# Patient Record
Sex: Female | Born: 1970
Health system: Southern US, Community
[De-identification: ages and names within clinical notes are randomized; demographics above are authoritative.]

## PROBLEM LIST (undated history)

## (undated) DIAGNOSIS — Q909 Down syndrome, unspecified: Secondary | ICD-10-CM

## (undated) DIAGNOSIS — E669 Obesity, unspecified: Secondary | ICD-10-CM

## (undated) DIAGNOSIS — E785 Hyperlipidemia, unspecified: Secondary | ICD-10-CM

## (undated) DIAGNOSIS — E039 Hypothyroidism, unspecified: Secondary | ICD-10-CM

## (undated) DIAGNOSIS — M47812 Spondylosis without myelopathy or radiculopathy, cervical region: Secondary | ICD-10-CM

## (undated) DIAGNOSIS — I2699 Other pulmonary embolism without acute cor pulmonale: Secondary | ICD-10-CM

## (undated) DIAGNOSIS — I6529 Occlusion and stenosis of unspecified carotid artery: Secondary | ICD-10-CM

## (undated) DIAGNOSIS — L732 Hidradenitis suppurativa: Secondary | ICD-10-CM

## (undated) DIAGNOSIS — I951 Orthostatic hypotension: Secondary | ICD-10-CM

## (undated) DIAGNOSIS — J309 Allergic rhinitis, unspecified: Secondary | ICD-10-CM

## (undated) DIAGNOSIS — R001 Bradycardia, unspecified: Secondary | ICD-10-CM

## (undated) DIAGNOSIS — I341 Nonrheumatic mitral (valve) prolapse: Secondary | ICD-10-CM

## (undated) HISTORY — DX: Bradycardia, unspecified: R00.1

## (undated) HISTORY — PX: OTHER SURGICAL HISTORY: SHX169

## (undated) HISTORY — DX: Orthostatic hypotension: I95.1

## (undated) HISTORY — DX: Down syndrome, unspecified: Q90.9

## (undated) HISTORY — DX: Obesity, unspecified: E66.9

## (undated) HISTORY — DX: Hidradenitis suppurativa: L73.2

## (undated) HISTORY — DX: Spondylosis without myelopathy or radiculopathy, cervical region: M47.812

## (undated) HISTORY — DX: Hyperlipidemia, unspecified: E78.5

## (undated) HISTORY — DX: Occlusion and stenosis of unspecified carotid artery: I65.29

## (undated) HISTORY — DX: Other pulmonary embolism without acute cor pulmonale: I26.99

## (undated) HISTORY — DX: Nonrheumatic mitral (valve) prolapse: I34.1

## (undated) HISTORY — PX: MIDDLE EAR SURGERY: SHX713

## (undated) HISTORY — DX: Hypothyroidism, unspecified: E03.9

## (undated) HISTORY — PX: TONSILLECTOMY AND ADENOIDECTOMY: SUR1326

## (undated) HISTORY — DX: Allergic rhinitis, unspecified: J30.9

---

## 2000-03-02 ENCOUNTER — Encounter: Admission: RE | Admit: 2000-03-02 | Discharge: 2000-03-19 | Payer: Self-pay | Admitting: Orthopedic Surgery

## 2000-11-04 ENCOUNTER — Encounter: Admission: RE | Admit: 2000-11-04 | Discharge: 2000-11-30 | Payer: Self-pay | Admitting: *Deleted

## 2001-02-25 ENCOUNTER — Encounter: Payer: Self-pay | Admitting: *Deleted

## 2001-02-25 ENCOUNTER — Emergency Department (HOSPITAL_COMMUNITY): Admission: EM | Admit: 2001-02-25 | Discharge: 2001-02-25 | Payer: Self-pay | Admitting: *Deleted

## 2001-04-18 ENCOUNTER — Other Ambulatory Visit: Admission: RE | Admit: 2001-04-18 | Discharge: 2001-04-18 | Payer: Self-pay | Admitting: Gynecology

## 2003-06-21 ENCOUNTER — Other Ambulatory Visit: Admission: RE | Admit: 2003-06-21 | Discharge: 2003-06-21 | Payer: Self-pay | Admitting: Gynecology

## 2005-02-20 ENCOUNTER — Ambulatory Visit (HOSPITAL_COMMUNITY): Admission: RE | Admit: 2005-02-20 | Discharge: 2005-02-20 | Payer: Self-pay | Admitting: Family Medicine

## 2007-04-07 ENCOUNTER — Other Ambulatory Visit: Admission: RE | Admit: 2007-04-07 | Discharge: 2007-04-07 | Payer: Self-pay | Admitting: Gynecology

## 2007-07-11 ENCOUNTER — Encounter: Admission: RE | Admit: 2007-07-11 | Discharge: 2007-07-11 | Payer: Self-pay | Admitting: Otolaryngology

## 2007-07-26 ENCOUNTER — Ambulatory Visit: Payer: Self-pay | Admitting: Oncology

## 2007-08-24 LAB — CBC WITH DIFFERENTIAL/PLATELET
BASO%: 1 % (ref 0.0–2.0)
Basophils Absolute: 0 10*3/uL (ref 0.0–0.1)
EOS%: 0.4 % (ref 0.0–7.0)
Eosinophils Absolute: 0 10*3/uL (ref 0.0–0.5)
HCT: 38.4 % (ref 34.8–46.6)
HGB: 13.4 g/dL (ref 11.6–15.9)
LYMPH%: 24.3 % (ref 14.0–48.0)
MCH: 33.1 pg (ref 26.0–34.0)
MCHC: 34.8 g/dL (ref 32.0–36.0)
MCV: 95 fL (ref 81.0–101.0)
MONO#: 0.4 10*3/uL (ref 0.1–0.9)
MONO%: 9 % (ref 0.0–13.0)
NEUT#: 3.1 10*3/uL (ref 1.5–6.5)
NEUT%: 65.2 % (ref 39.6–76.8)
Platelets: 221 10*3/uL (ref 145–400)
RBC: 4.04 10*6/uL (ref 3.70–5.32)
RDW: 11.3 % (ref 11.3–14.5)
WBC: 4.7 10*3/uL (ref 3.9–10.0)
lymph#: 1.2 10*3/uL (ref 0.9–3.3)

## 2007-08-24 LAB — MORPHOLOGY
PLT EST: ADEQUATE
RBC Comments: NORMAL

## 2007-08-24 LAB — CHCC SMEAR

## 2007-08-24 LAB — ERYTHROCYTE SEDIMENTATION RATE: Sed Rate: 10 mm/hr (ref 0–30)

## 2007-08-27 LAB — CMV IGM: CMV IgM: <0.9 Index (ref <0.90)

## 2007-08-27 LAB — HEPATITIS C ANTIBODY: HCV Ab: NEGATIVE

## 2007-08-27 LAB — EPSTEIN-BARR VIRUS VCA, IGG: EBV VCA IgG: 4.95 {ISR} — ABNORMAL HIGH

## 2007-08-27 LAB — EPSTEIN-BARR VIRUS NUCLEAR ANTIGEN ANTIBODY, IGG: EBV NA IgG: 3.96 {ISR} — ABNORMAL HIGH

## 2007-09-15 ENCOUNTER — Ambulatory Visit: Payer: Self-pay | Admitting: Oncology

## 2007-11-02 ENCOUNTER — Observation Stay (HOSPITAL_COMMUNITY): Admission: AD | Admit: 2007-11-02 | Discharge: 2007-11-03 | Payer: Self-pay | Admitting: Otolaryngology

## 2007-11-02 ENCOUNTER — Encounter (INDEPENDENT_AMBULATORY_CARE_PROVIDER_SITE_OTHER): Payer: Self-pay | Admitting: Otolaryngology

## 2008-11-19 ENCOUNTER — Encounter: Admission: RE | Admit: 2008-11-19 | Discharge: 2009-01-28 | Payer: Self-pay | Admitting: Orthopedic Surgery

## 2008-12-18 IMAGING — CT CT ORBIT/TEMPORAL/IAC W/O CM
3 of 5 series · 16 of 30 positions shown, 18 images · IV contrast (agent unspecified)
Comparison: None.

CLINICAL DATA: 36 year old female with hearing loss in the right ear.  History of tubes.  Down?s syndrome.  Additional history of Moyamoya status post brain bypass.  
TEMPORAL BONE CT WITHOUT CONTRAST:
TECHNIQUE: Axial and coronal plane CT imaging of the petrous temporal bones was performed with thin-collimation image reconstruction.  No intravenous contrast was administered.

[Series 2: temporal bones/iac's · axial · 0.33mm/px · z∈[-11,-1]mm · 2 of 64 slices shown]
[im 16/64  bone]
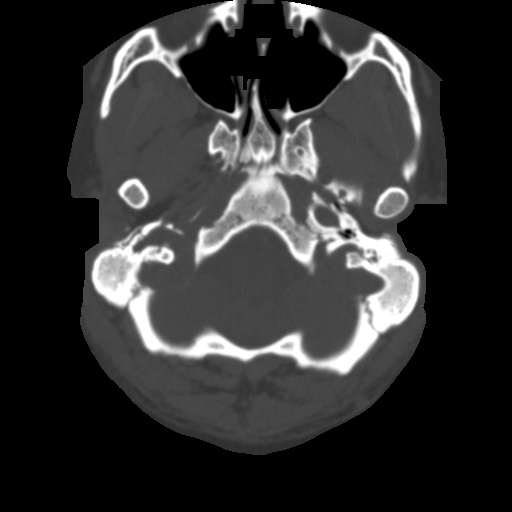
[im 32/64  bone]
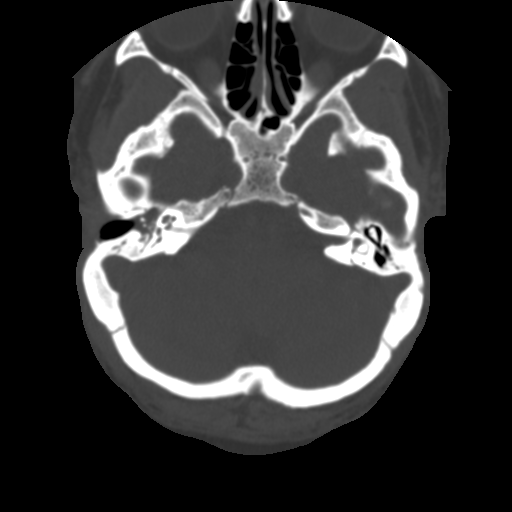

[Series 3: rt/mag/bone · axial · 0.20mm/px · z∈[-16,+14]mm · 7 of 126 slices shown, 9 images]
[im 16/126  brain]
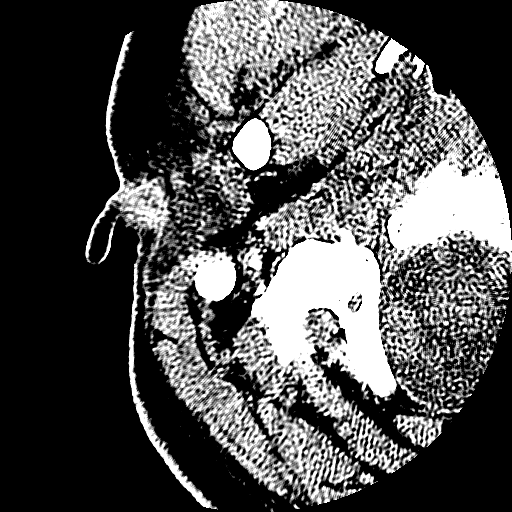
[im 16/126  bone]
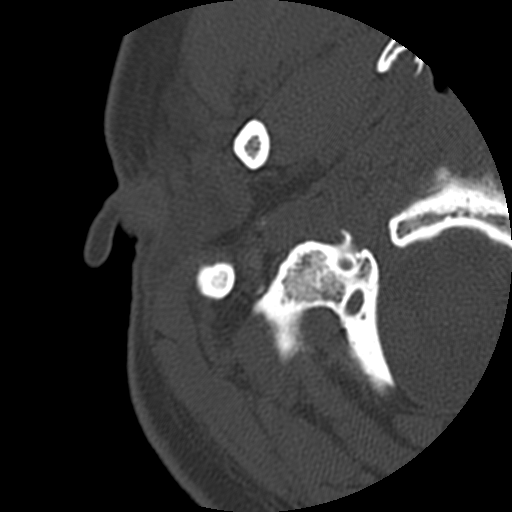
[im 32/126  bone]
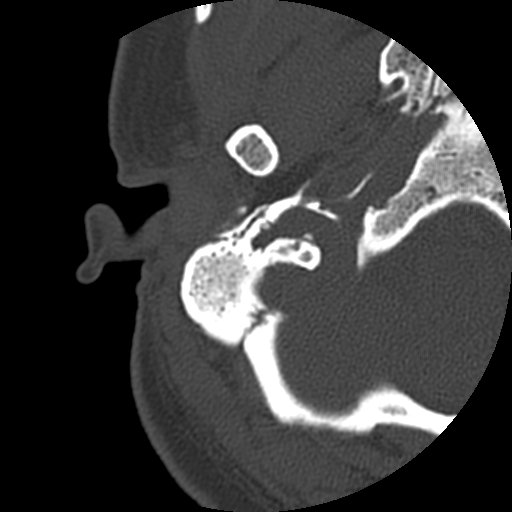
[im 47/126  bone]
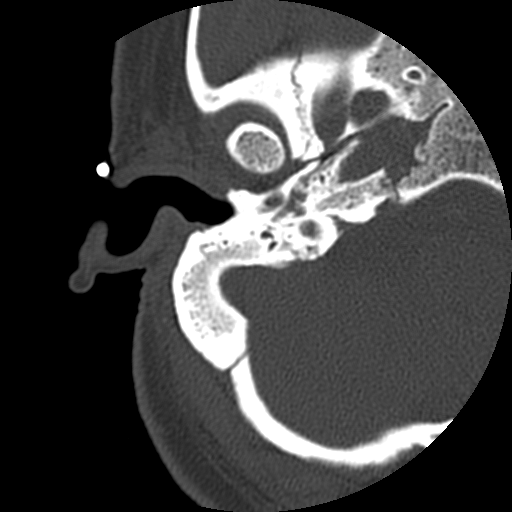
[im 63/126  bone]
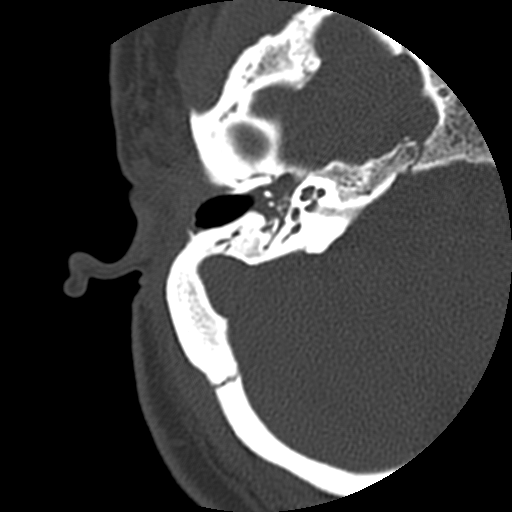
[im 79/126  brain]
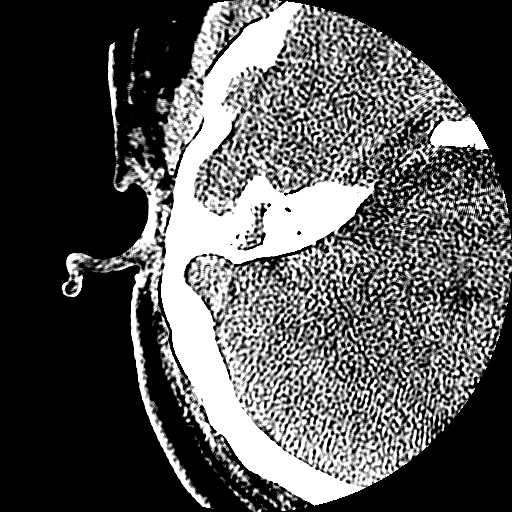
[im 79/126  bone]
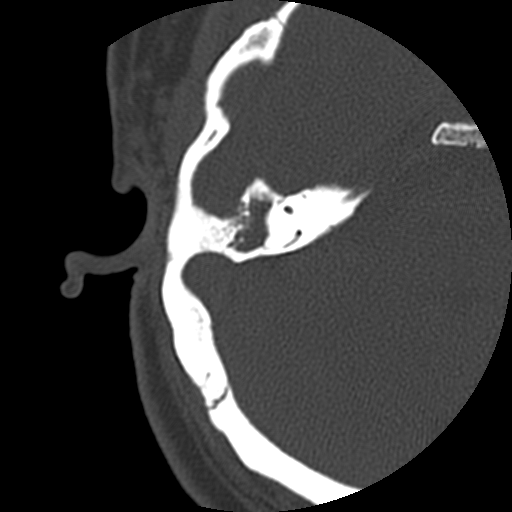
[im 94/126  bone]
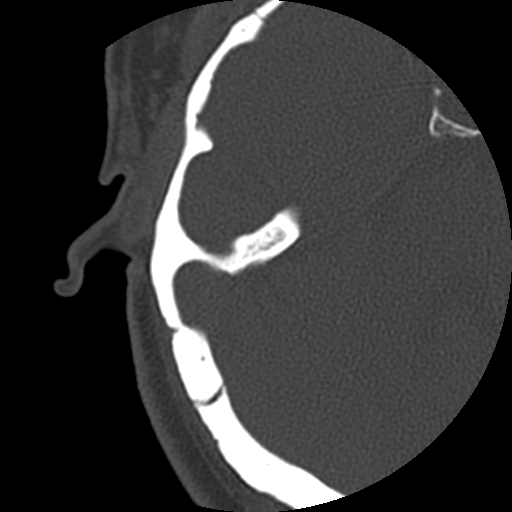
[im 110/126  bone]
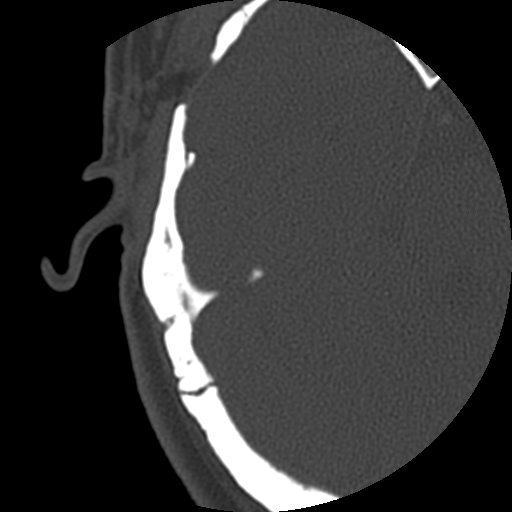

[Series 4: lt/mag/bone · axial · 0.20mm/px · z∈[-16,+14]mm · 7 of 126 slices shown]
[im 16/126  bone]
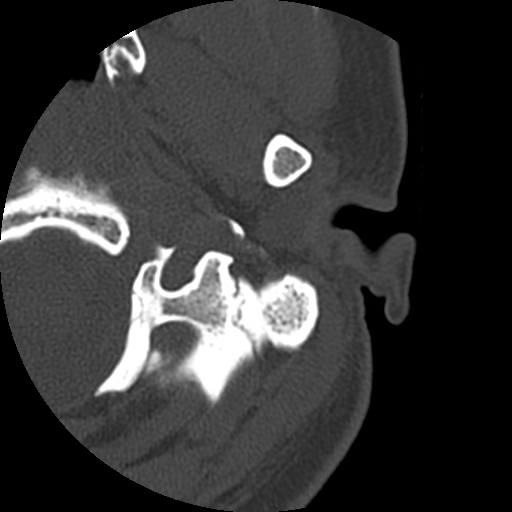
[im 32/126  bone]
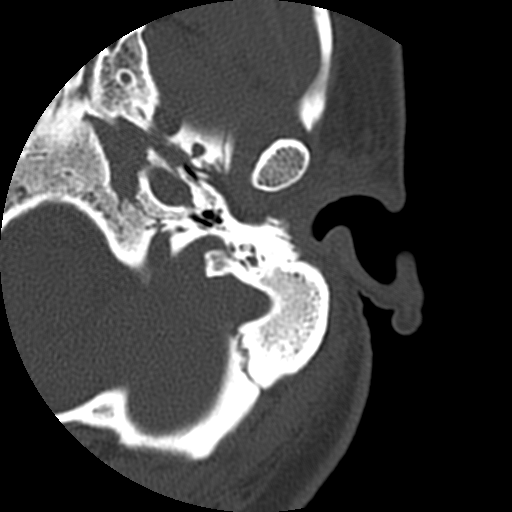
[im 47/126  bone]
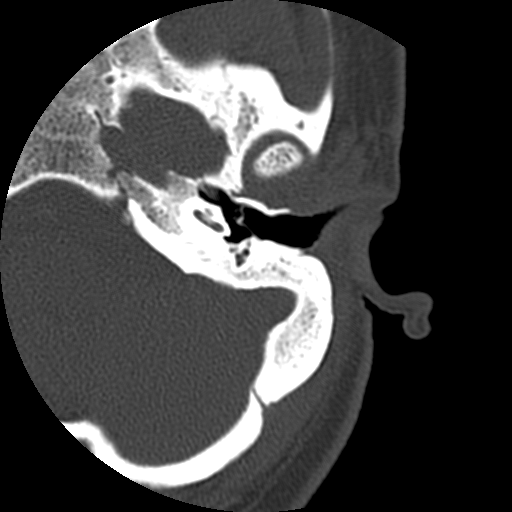
[im 63/126  bone]
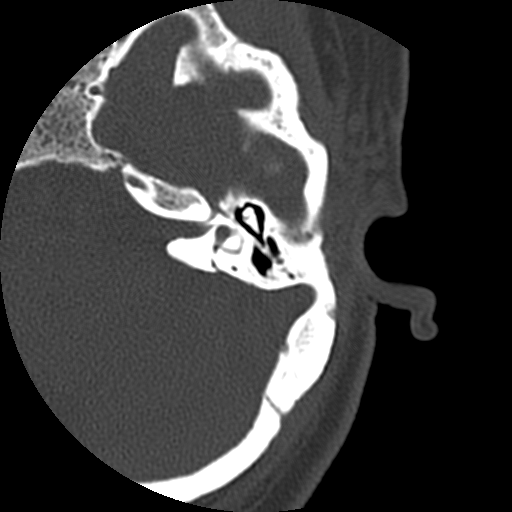
[im 79/126  bone]
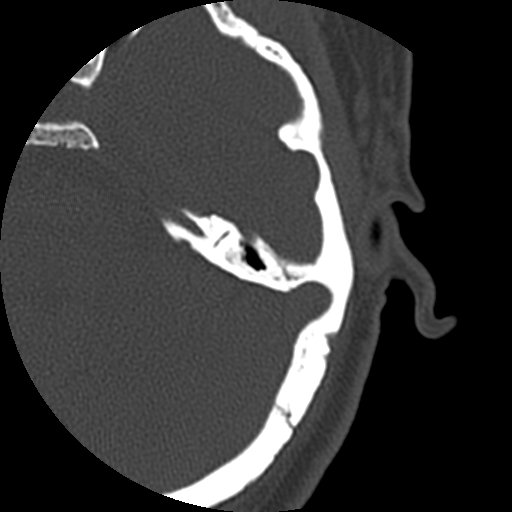
[im 94/126  bone]
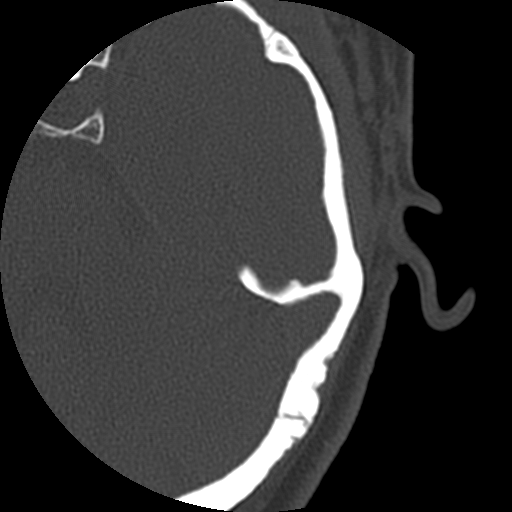
[im 110/126  bone]
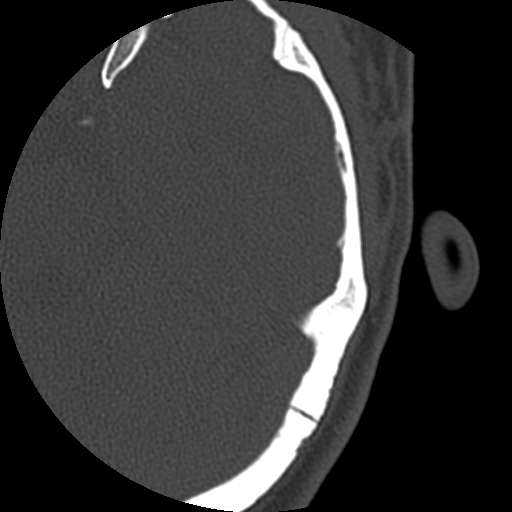

[16 of 30 positions shown; findings below may reference images not displayed]

FINDINGS: Visualized brain parenchyma is remarkable for generalized volume loss.  Sequelae of bitemporal craniotomy are partially visualized.  Visualized posterior orbits are normal.  Visualized paranasal sinuses are clear.  There is hypoplasia of the sphenoid sinuses.  
There is hypoplasia of the left mastoid air cells.  The aditus ad antrum and tympanic cavity are normally pneumatized.  There is abnormal thickening of the tympanic membrane, especially its inferior attachment.  The ossicles appear normally located and within the normal limits.  The EAC, IAC, cochlea, vestibular apparatus, and course of the VIIth cranial nerve are within normal limits.  
The right mastoid air cells are hypoplastic and are completely opacified.  No osseous erosion is identified and the sigmoid plate appears intact.  There is complete opacification of the right tympanic cavity.  The ossicles are not dislocated and appear within normal limits.  The scutum appears blunted.  The EAC is within normal limits.  The tympanic membrane appears internally retracted.  A tympanostomy tube is in place.  The IAC, cochlea and vestibular apparatus are normal.   The course of the right VIIth cranial nerve is within normal limits.
IMPRESSION: 1.  Right tympanostomy tube in place with complete opacification of the right tympanic cavity and mastoids.  This could reflect infectious/inflammatory otomastoiditis or cholesteatoma given the blunted appearance of the right scutum.
2.  Thickening of the anteroinferior left tympanic membrane, otherwise the left temporal bone is normal.
3.  Bilateral hypoplastic mastoid air cells.

## 2009-05-08 ENCOUNTER — Encounter: Admission: RE | Admit: 2009-05-08 | Discharge: 2009-05-08 | Payer: Self-pay | Admitting: Otolaryngology

## 2010-12-07 ENCOUNTER — Encounter: Payer: Self-pay | Admitting: Otolaryngology

## 2011-03-31 NOTE — H&P (Signed)
NAMESHELLBY, SCHLINK NO.:  [id number]   MEDICAL RECORD NO.:  [id number]          PATIENT TYPE:  INP   LOCATION:  5122                         FACILITY:  [facility]   PHYSICIAN:  Carolan Shiver, M.D.    DATE OF BIRTH:  May 15, 1971   DATE OF ADMISSION:  11/02/2007  DATE OF DISCHARGE:                              HISTORY & PHYSICAL   CHIEF COMPLAINT:  Decreased hearing, right ear, with chronic infection.   HISTORY OF PRESENT ILLNESS:  Nicole Wilson is a 40 year old white  female with trisomy 43 who has developed chronic right mastoiditis and  otitis media with moderate mixed hearing loss, right ear, after lifelong  history of chronic ear disease.  Nicole Wilson first saw me at age 51.  She had  had tubes placed in 1979 by another surgeon, and they were removed in  1992, again by another surgeon.  When seen by me, she was found to have  central perforation of the right tympanic membrane and had trisomy 21.  On January 04, 1985, she underwent a type 1 tympanoplasty right ear  without complication.  Subsequent to that, she required revision BMTs on  January 11, 1987 for recurrent otitis media.  She then was followed.  Her tubes remained patent through 2001.  February 2001, she suffered a  mild stroke with numb hand and was diagnosed as having moyamoya disease.  In January 2003, she underwent a right intracranial to extracranial  bypass graft which was successful.  Tubes remained patent through April  of 2004.  They again were patent February 06, 2005, December 14, 2005.  However, the tubes ejected, and she required revision BMTs on December 06, 2006.  The left tube remained in place, the right tube ejected and  she developed recurrent thick mucoid effusion right ear and a moderate  mixed hearing loss.  A CT scan of her temporal bones documented chronic  right mastoiditis and otitis media, and she was recommended for right  canal tympanomastoidectomy, placement of a tube, right ear.   Risks and  complications of the procedures were explained to her mother.  Questions  were invited and answered.  Informed consent was signed and witnessed.   PAST MEDICAL HISTORY:  1. January 2003, right extracranial to intracranial bypass grafting of      her middle meningeal artery for moyamoya disease.  2. History of chronic ear disease.  3. T&A 1980s.  4. She had tubes placed in 1979, 1982 and as I mentioned above.   MEDICATIONS:  Currently include Synthroid, Lipitor, Zyrtec and aspirin.  The aspirin had been held.   ALLERGIES TO MEDICATIONS:  INCLUDE DILANTIN AND CEPHALOSPORINS.   SOCIAL HISTORY:  She lives with her parents and does have some  intermittent work.   REVIEW OF SYSTEMS:  Was negative for any heart disease, dermatitis, GI  disease or orthopedic problems.  She does have history of hypothyroidism  and is on thyroid replacement.  She had had C-spine films in the past  which were stable.   PHYSICAL EXAMINATION:  VITAL SIGNS:  Stable.  She  was an awake, alert 40 year old white female with trisomy 37.  She  had short stature.  She is very conversant with 85% intelligibility.  Facial function was intact to nystagmus.  Pupils PERLA.  External ears  were small and low set.  Right TM was thickened without landmarks and  absent tube.  Left tube is in position.  The ear was dried.  Nose  negative.  Oral cavity, lips, tongue, palate normal.  NECK:  Short, no bruits.  CHEST:  Clear.  HEART:  Normal sinus rhythm.  BREAST EXAM:  Was not done.  ABDOMINAL EXAM:  Negative.  GENITALIA/RECTAL EXAMS:  Were not done.  EXTREMITIES:  Were unremarkable.  SKULL:  She had a healed right postauricular incision and right  neurosurgical incision from the extracranial to intracranial bypass done  January of 2003 for moyamoya disease.   LABORATORY DATA:  Showed admission hemoglobin of 13.3, hematocrit of  39.2, white blood cell count of 4900.  PT was 13.4, PTT 26, INR 1.0.  Electrolytes  showed a sodium of 139, potassium 4.0, chloride 108, CO2  28, glucose was 92, BUN 10, creatinine 0.93.  Urinalysis was  unremarkable.  EKG done several years ago at age 74 showed sinus  bradycardia with marked sinus arrhythmia.   CT scan of her temporal bones performed July 11, 2007 showed chronic  right mastoiditis and otitis media.   IMPRESSION:  1. Chronic right otitis media and mastoiditis status post      tympanoplasty and multiple BMTs in the past.  2. Trisomy 21.  3. History of moyamoya disease status post right extracranial to      intracranial bypass.  4. Hypothyroidism.  5. Moderate mixed hearing loss, right ear, secondary to #1 above.   Recommend right canal tympanomastoidectomy with placement of tube, right  ear, under general endotracheal anesthesia at [facility]  November 02, 2007.  Nicole Wilson's mother understands that she may require a  prosthesis, bone cement, etc., for an ossiculoplasty.  She understands  that if she had congenital fixation of her stapes that I would not  recommend a stapedectomy as stapedectomies are not generally successful  in patient's with Down syndrome.  The mother is fully aware of this and  signed the informed consent.  Procedure is scheduled for November 02, 2007 at 9:30 a.m. in [place] OR room #10, Dr. Jacklynn Bue, general  anesthesia.           ______________________________  Carolan Shiver, M.D.     EMK/MEDQ  D:  11/02/2007  T:  11/03/2007  Job:  [id number]   cc:   Carolan Shiver, M.D.

## 2011-03-31 NOTE — Op Note (Signed)
NAMESEDONA, WENK NO.:  [id number]   MEDICAL RECORD NO.:  [id number]          PATIENT TYPE:  INP   LOCATION:  5122                         FACILITY:  [facility]   PHYSICIAN:  Carolan Shiver, M.D.    DATE OF BIRTH:  10-24-1971   DATE OF PROCEDURE:  11/02/2007  DATE OF DISCHARGE:                               OPERATIVE REPORT   JUSTIFICATION FOR PROCEDURE:  Nicole Wilson is a 40 year old white  female with trisomy 76 here today for right tympanomastoidectomy to  treat chronic right mastoiditis and otitis media with moderate mixed  hearing loss, right ear.  [name] has had a lifelong history of chronic ear  disease.  She was first seen by me at age 43.  She had had tubes placed  in her ears in 1979 and removed in 1982.  I performed a type 1  tympanoplasty of the right ear on January 04, 1985.  She healed well.  She underwent revision BMT on January 11, 1987, and has been followed  routinely since that time.  By September 08, 2000, both tubes were patent.  They remained patent through February 22, 2003, and through February 06, 2005.  December 06, 2006, she underwent revision BMT as a tube was embedded in  right middle ear mucosa and she was found to have thick fluid.  That  tube eventually ejected after July 22, 2007.  She was complaining of  decreased hearing in the right ear. Audiometric testing showed a  moderate mixed hearing loss.  A CT scan of her temporal bone showed  chronic right mastoiditis and otitis media.  Because of this, she was  recommended for right tympanomastoidectomy and replacement of the tube,  right ear.  The risks and complications of the procedures were explained  Katricia and to her mother.  Questions were invited and answered.  Informed  consent was signed and witnessed.   JUSTIFICATION FOR OUTPATIENT SETTING:  The patient's age and general  endotracheal anesthesia.   JUSTIFICATION FOR OVERNIGHT STAY:  1. 23 hours of observation and facial function  status post right canal      up tympanomastoidectomy.  2. The patient has trisomy 4.   PREOPERATIVE DIAGNOSIS:  1. Chronic otitis media and mastoiditis, right ear.  2. Trisomy 21.  3. Moderate mixed hearing loss, right ear.   POSTOPERATIVE DIAGNOSES:  1. Chronic otitis media and mastoiditis, right ear.  2. Trisomy 21.  3. Moderate mixed hearing loss, right ear.   OPERATION:  Right canal up tympanomastoidectomy with placement of  Paparella type 1 tube, right ear.   SURGEON:  Carolan Shiver, M.D.   ANESTHESIA:  General endotracheal anesthesia, Dr. Sharee Holster.   COMPLICATIONS:  None.   SUMMARY OF REPORT:  After the patient was taken to the operating room,  she was placed in the supine position.  General IV induction was then  performed under the guidance of Dr. Sharee Holster. The patient was  orally intubated without difficulty.  Eyelids were taped shut.  She was  properly positioned and monitored.  Elbows and ankles were  padded with  foam rubber and a time out was performed.  A Foley catheter was  inserted.  Several wire needle electrodes were inserted into the right  orbicularis oris and oculi muscles and connected to a facial nerve  monitor pre-amplifier.  Her hair was clipped in the right postauricular  area, hair was taped, stocking cap was applied.  The previous right  postauricular incision was then marked and infiltrated with 7 mL of 1%  Xylocaine with 1:100,000 epinephrine.  The right ear and hemi-face were  then prepped with Betadine and draped in a standard fashion for a  tympanomastoidectomy.   Examination of the right ear canal revealed large crust in the right  canal.  This was removed.  Her TM was thickened and without very many  landmarks. She had a prominent anterior canal wall hump.  The ear canal  was cleaned.  Four quadrant ear canal blocks were performed with 1 mL of  2% Xylocaine with 1:50,000 epinephrine.  A right postauricular incision,  which had  been marked and tattooed, was then incised with a 15 blade.  A  T-shaped incision was made.  The mastoid, periosteum, anterior and  posterior subperiosteal flaps were elevated.  The mastoid cortex was  then removed with cutting burs using continuous suction irrigation.  The  patient was found to have an anteriorly displaced sigmoid sinus and a  very low hanging dura.  Upon removing 1 mm of the cortex just inferior  to the temporal line, I encountered dura.  Dura was extending inferiorly  more then in most ears.  The dura actually extended to the superior  canal wall.  The sigmoid sinus was identified and followed to the  digastric periosteum.  The posterior canal wall was thinned. The mastoid  antrum was then entered carefully and was found to be filled with  chronically infected tissue.  The contents were removed and I could just  discern the dome of the lateral semicircular canal.  I could not see  into the attic because of the low hanging dura which was literally  extended to the superior canal wall.   The posterior canal wall skin was then dissected medially to the  annulus.  The middle ear was entered posterior superiorly.  The ossicles  were found to be intact.  They were engulfed in thick fibrotic scar  tissue, particularly the lenticular process and the stapes. Scar tissue  was removed freeing the ossicles.  The malleus was mobile but was not  normally mobile.  The incus and stapes were mobile.  The stapes had very  broad crura.  The facial nerve was covered in the horizontal portion by  bone.  All scar bands were lysed.  An incision was made in the posterior  canal wall skin between 6 and 12 o'clock with a 5910 beaver blade.  An  anterior-inferior radial myringotomy incision was made and thick fluid  was suction evacuated from a compartmentalized pocket anteriorly.  Further adhesions were then lysed in the middle ear.  A modified  Richards T-tube was inserted.  A temporalis  fascial graft was harvested  and placed medial to the posterior superior quadrant of the tympanic  membrane for reinforcement.  A sliver of Penrose drain was placed in the  mastoid cavity.   The entire cavity was copiously irrigated with bacitracin containing  saline.  The saline did irrigate from the antrum through the attic into  the middle ear.  The temporalis fascial graft and  atticotomy flap were  draped off the posterior bony canal wall in the anatomic position.  The  medial canal was packed with Gelfoam soaked in Ciprodex, the lateral  canal was packed with 1/4 inch Iodoform Nugauze wick impregnated with  bacitracin ointment.  The postauricular incision was then closed in  three layers using interrupted inverted 3-0 Monocryl for mucoperiosteal  layer and the same for the subcutaneous layer. Prior to closing the  subcutaneous layer, the site was copiously irrigated with bacitracin  containing saline.  The skin was closed with running locking 5-0  Ethilon.  Bacitracin ointment was applied to the postauricular incision.  The ear was padded with Telfa and cotton and a standard adult Glasscock  mastoid dressing was applied loosely in the standard fashion.  The  patient's Foley catheter was removed.  She was awakened, extubated and  transferred to her hospital bed.  She appeared to tolerate the general  endotracheal anesthesia and the procedures well and left the operating  room in stable condition.   Total fluids 1800 mL.  Total urine output 400 mL.  Total blood loss less  than 20 mL.  Sponge, needle and cotton ball counts were correct at the  termination of the procedure.  Right middle ear contents were sent to  pathology.  The patient received clindamycin 600 mg IV and Zofran 4 mg  IV at the beginning and end of the procedure and Decadron 10 mg IV.   Shadi will be admitted to a private room and will have overnight  observation.  If stable overnight, she will be discharged on  November 03, 2007, with her parents who will be instructed to return her to my  office on November 08, 2007, at 4:20 p.m.   Discharge medications include Cipro 750 mg, #21, p.o. b.i.d. x10 days  with food, Percocet 5/325, #30, 1-2 p.o. q.6h. p.r.n. pain, Ciprodex  Otic 7.5 mL 3 drops AD t.i.d. x7 days, and Phenergan 25 mg, #2,  suppositories one PR q.6h. p.r.n. nausea.  Her parents are to have her  follow a soft diet today, regular diet tomorrow, keep her head elevated,  avoid aspirin or aspirin products.  They are to avoid water exposure AD  x6 weeks.  They are to call [phone] for any postoperative problems  directly related to the procedures.  They will be given both verbal and  written instructions.   SUMMARY:  The patient was found to have chronic disease in her right  mastoid antrum.  She had a fairly sclerotic contracted mastoid with  anteriorly displaced sigmoid sinus and inferiorly displaced dura that  extended to the superior canal wall.  I could not open into the attic  proper due to the low hanging dura.  Complete mastoidectomy was  performed.  The middle ear was found to contain thick, fibrotic scar  tissue which engulfed the incus and stapes. The fibrotic tissue was  removed freeing the ossicles.  A Paparella type 1 tube was placed.  The  middle ear mucosa was thickened and somewhat fibrotic. No cholesteatoma  was found.  No prostheses were inserted and bone cement was not used.  The tympanic membrane was thickened and without landmarks.  She had a  prominent anterior canal wall hump.  The chorda tympani nerve was not  identified.  The facial nerve was covered by bone in the horizontal  portion.           ______________________________  Carolan Shiver, M.D.  EMK/MEDQ  D:  11/02/2007  T:  11/02/2007  Job:  [id number]

## 2011-08-21 LAB — CBC
HCT: 39.2
Hemoglobin: 13.3
RBC: 4.08

## 2011-08-21 LAB — COMPREHENSIVE METABOLIC PANEL
ALT: 18
Calcium: 8.7
Creatinine, Ser: 0.93
GFR calc Af Amer: >60
GFR calc non Af Amer: >60
Glucose, Bld: 92
Sodium: 139
Total Protein: 6.6

## 2011-08-21 LAB — URINALYSIS, ROUTINE W REFLEX MICROSCOPIC
Ketones, ur: NEGATIVE
Nitrite: NEGATIVE
Protein, ur: NEGATIVE
Urobilinogen, UA: 0.2

## 2011-08-21 LAB — PROTIME-INR
INR: 1
Prothrombin Time: 13.4

## 2011-08-21 LAB — DIFFERENTIAL
Eosinophils Absolute: 0 — ABNORMAL LOW
Lymphocytes Relative: 26
Lymphs Abs: 1.2
Monocytes Relative: 10
Neutrophils Relative %: 64

## 2011-08-21 LAB — APTT: aPTT: 26

## 2011-08-21 LAB — TSH: TSH: 2.083

## 2012-03-03 DIAGNOSIS — I675 Moyamoya disease: Secondary | ICD-10-CM | POA: Insufficient documentation

## 2012-09-15 ENCOUNTER — Other Ambulatory Visit: Payer: Self-pay | Admitting: Family Medicine

## 2012-09-15 ENCOUNTER — Other Ambulatory Visit (HOSPITAL_COMMUNITY)
Admission: RE | Admit: 2012-09-15 | Discharge: 2012-09-15 | Disposition: A | Discharge status: Home / Self Care | Payer: Medicaid Other | Source: Ambulatory Visit | Attending: Family Medicine | Admitting: Family Medicine

## 2012-09-15 DIAGNOSIS — Z124 Encounter for screening for malignant neoplasm of cervix: Secondary | ICD-10-CM | POA: Insufficient documentation

## 2012-09-15 DIAGNOSIS — Z1151 Encounter for screening for human papillomavirus (HPV): Secondary | ICD-10-CM | POA: Insufficient documentation

## 2012-11-29 DIAGNOSIS — Q909 Down syndrome, unspecified: Secondary | ICD-10-CM | POA: Diagnosis not present

## 2012-11-29 DIAGNOSIS — H65499 Other chronic nonsuppurative otitis media, unspecified ear: Secondary | ICD-10-CM | POA: Diagnosis not present

## 2013-03-09 DIAGNOSIS — Q909 Down syndrome, unspecified: Secondary | ICD-10-CM | POA: Diagnosis not present

## 2013-03-09 DIAGNOSIS — H6980 Other specified disorders of Eustachian tube, unspecified ear: Secondary | ICD-10-CM | POA: Diagnosis not present

## 2013-03-09 DIAGNOSIS — H65499 Other chronic nonsuppurative otitis media, unspecified ear: Secondary | ICD-10-CM | POA: Diagnosis not present

## 2013-03-09 DIAGNOSIS — H6520 Chronic serous otitis media, unspecified ear: Secondary | ICD-10-CM | POA: Diagnosis not present

## 2013-03-09 DIAGNOSIS — H906 Mixed conductive and sensorineural hearing loss, bilateral: Secondary | ICD-10-CM | POA: Diagnosis not present

## 2013-03-17 DIAGNOSIS — Q909 Down syndrome, unspecified: Secondary | ICD-10-CM | POA: Diagnosis not present

## 2013-03-17 DIAGNOSIS — I675 Moyamoya disease: Secondary | ICD-10-CM | POA: Diagnosis not present

## 2013-03-30 DIAGNOSIS — H6980 Other specified disorders of Eustachian tube, unspecified ear: Secondary | ICD-10-CM | POA: Diagnosis not present

## 2013-03-30 DIAGNOSIS — Q909 Down syndrome, unspecified: Secondary | ICD-10-CM | POA: Diagnosis not present

## 2013-03-30 DIAGNOSIS — H906 Mixed conductive and sensorineural hearing loss, bilateral: Secondary | ICD-10-CM | POA: Diagnosis not present

## 2013-04-27 DIAGNOSIS — I675 Moyamoya disease: Secondary | ICD-10-CM | POA: Diagnosis not present

## 2013-04-27 DIAGNOSIS — R209 Unspecified disturbances of skin sensation: Secondary | ICD-10-CM | POA: Diagnosis not present

## 2013-05-10 DIAGNOSIS — H903 Sensorineural hearing loss, bilateral: Secondary | ICD-10-CM | POA: Diagnosis not present

## 2013-05-10 DIAGNOSIS — Q909 Down syndrome, unspecified: Secondary | ICD-10-CM | POA: Diagnosis not present

## 2013-05-10 DIAGNOSIS — H9319 Tinnitus, unspecified ear: Secondary | ICD-10-CM | POA: Diagnosis not present

## 2013-05-10 DIAGNOSIS — H6980 Other specified disorders of Eustachian tube, unspecified ear: Secondary | ICD-10-CM | POA: Diagnosis not present

## 2013-06-21 ENCOUNTER — Other Ambulatory Visit: Payer: Self-pay | Admitting: Orthopedic Surgery

## 2013-06-21 DIAGNOSIS — M545 Low back pain, unspecified: Secondary | ICD-10-CM | POA: Diagnosis not present

## 2013-06-21 DIAGNOSIS — M4802 Spinal stenosis, cervical region: Secondary | ICD-10-CM

## 2013-06-21 DIAGNOSIS — M542 Cervicalgia: Secondary | ICD-10-CM | POA: Diagnosis not present

## 2013-06-21 DIAGNOSIS — M546 Pain in thoracic spine: Secondary | ICD-10-CM | POA: Diagnosis not present

## 2013-06-23 ENCOUNTER — Ambulatory Visit
Admission: RE | Admit: 2013-06-23 | Discharge: 2013-06-23 | Disposition: A | Discharge status: Home / Self Care | Payer: Medicare Other | Source: Ambulatory Visit | Attending: Orthopedic Surgery | Admitting: Orthopedic Surgery

## 2013-06-23 DIAGNOSIS — M4802 Spinal stenosis, cervical region: Secondary | ICD-10-CM

## 2013-06-23 DIAGNOSIS — M47812 Spondylosis without myelopathy or radiculopathy, cervical region: Secondary | ICD-10-CM | POA: Diagnosis not present

## 2013-06-23 DIAGNOSIS — M5030 Other cervical disc degeneration, unspecified cervical region: Secondary | ICD-10-CM | POA: Diagnosis not present

## 2013-07-07 DIAGNOSIS — M5030 Other cervical disc degeneration, unspecified cervical region: Secondary | ICD-10-CM | POA: Diagnosis not present

## 2013-07-07 DIAGNOSIS — M542 Cervicalgia: Secondary | ICD-10-CM | POA: Diagnosis not present

## 2013-07-07 DIAGNOSIS — R6889 Other general symptoms and signs: Secondary | ICD-10-CM | POA: Diagnosis not present

## 2013-07-07 DIAGNOSIS — M47812 Spondylosis without myelopathy or radiculopathy, cervical region: Secondary | ICD-10-CM | POA: Diagnosis not present

## 2013-07-20 DIAGNOSIS — H6980 Other specified disorders of Eustachian tube, unspecified ear: Secondary | ICD-10-CM | POA: Diagnosis not present

## 2013-07-20 DIAGNOSIS — H9319 Tinnitus, unspecified ear: Secondary | ICD-10-CM | POA: Diagnosis not present

## 2013-07-20 DIAGNOSIS — H18609 Keratoconus, unspecified, unspecified eye: Secondary | ICD-10-CM | POA: Diagnosis not present

## 2013-07-20 DIAGNOSIS — H52209 Unspecified astigmatism, unspecified eye: Secondary | ICD-10-CM | POA: Diagnosis not present

## 2013-07-20 DIAGNOSIS — H5000 Unspecified esotropia: Secondary | ICD-10-CM | POA: Diagnosis not present

## 2013-07-20 DIAGNOSIS — H903 Sensorineural hearing loss, bilateral: Secondary | ICD-10-CM | POA: Diagnosis not present

## 2013-07-20 DIAGNOSIS — H259 Unspecified age-related cataract: Secondary | ICD-10-CM | POA: Diagnosis not present

## 2013-07-20 DIAGNOSIS — Q909 Down syndrome, unspecified: Secondary | ICD-10-CM | POA: Diagnosis not present

## 2013-09-05 DIAGNOSIS — Q909 Down syndrome, unspecified: Secondary | ICD-10-CM | POA: Diagnosis not present

## 2013-09-05 DIAGNOSIS — H6980 Other specified disorders of Eustachian tube, unspecified ear: Secondary | ICD-10-CM | POA: Diagnosis not present

## 2013-09-05 DIAGNOSIS — H903 Sensorineural hearing loss, bilateral: Secondary | ICD-10-CM | POA: Diagnosis not present

## 2013-09-05 DIAGNOSIS — H9319 Tinnitus, unspecified ear: Secondary | ICD-10-CM | POA: Diagnosis not present

## 2013-09-11 DIAGNOSIS — Z23 Encounter for immunization: Secondary | ICD-10-CM | POA: Diagnosis not present

## 2013-09-12 ENCOUNTER — Other Ambulatory Visit: Payer: Self-pay | Admitting: General Surgery

## 2013-09-12 MED ORDER — FLUDROCORTISONE ACETATE 0.1 MG PO TABS: ORAL_TABLET | ORAL | Status: DC

## 2013-09-19 DIAGNOSIS — D485 Neoplasm of uncertain behavior of skin: Secondary | ICD-10-CM | POA: Diagnosis not present

## 2013-09-19 DIAGNOSIS — L821 Other seborrheic keratosis: Secondary | ICD-10-CM | POA: Diagnosis not present

## 2013-09-19 DIAGNOSIS — D2370 Other benign neoplasm of skin of unspecified lower limb, including hip: Secondary | ICD-10-CM | POA: Diagnosis not present

## 2013-09-19 DIAGNOSIS — D239 Other benign neoplasm of skin, unspecified: Secondary | ICD-10-CM | POA: Diagnosis not present

## 2013-09-19 DIAGNOSIS — D2330 Other benign neoplasm of skin of unspecified part of face: Secondary | ICD-10-CM | POA: Diagnosis not present

## 2013-09-19 DIAGNOSIS — D235 Other benign neoplasm of skin of trunk: Secondary | ICD-10-CM | POA: Diagnosis not present

## 2013-09-19 DIAGNOSIS — D2360 Other benign neoplasm of skin of unspecified upper limb, including shoulder: Secondary | ICD-10-CM | POA: Diagnosis not present

## 2013-10-05 ENCOUNTER — Encounter: Payer: Self-pay | Admitting: *Deleted

## 2013-10-05 ENCOUNTER — Encounter: Payer: Self-pay | Admitting: Cardiology

## 2013-10-05 DIAGNOSIS — E785 Hyperlipidemia, unspecified: Secondary | ICD-10-CM | POA: Insufficient documentation

## 2013-10-05 DIAGNOSIS — Q909 Down syndrome, unspecified: Secondary | ICD-10-CM | POA: Insufficient documentation

## 2013-10-05 DIAGNOSIS — I951 Orthostatic hypotension: Secondary | ICD-10-CM | POA: Insufficient documentation

## 2013-10-05 DIAGNOSIS — L732 Hidradenitis suppurativa: Secondary | ICD-10-CM | POA: Insufficient documentation

## 2013-10-05 DIAGNOSIS — J309 Allergic rhinitis, unspecified: Secondary | ICD-10-CM | POA: Insufficient documentation

## 2013-10-05 DIAGNOSIS — I2510 Atherosclerotic heart disease of native coronary artery without angina pectoris: Secondary | ICD-10-CM | POA: Insufficient documentation

## 2013-10-05 DIAGNOSIS — E039 Hypothyroidism, unspecified: Secondary | ICD-10-CM | POA: Insufficient documentation

## 2013-10-06 ENCOUNTER — Encounter: Payer: Self-pay | Admitting: General Surgery

## 2013-10-06 ENCOUNTER — Ambulatory Visit (INDEPENDENT_AMBULATORY_CARE_PROVIDER_SITE_OTHER): Payer: Medicare Other | Admitting: Cardiology

## 2013-10-06 ENCOUNTER — Encounter: Payer: Self-pay | Admitting: Cardiology

## 2013-10-06 VITALS — BP 98/66 | HR 66 | Ht <= 58 in | Wt 144.0 lb

## 2013-10-06 DIAGNOSIS — R071 Chest pain on breathing: Secondary | ICD-10-CM | POA: Diagnosis not present

## 2013-10-06 DIAGNOSIS — R001 Bradycardia, unspecified: Secondary | ICD-10-CM

## 2013-10-06 DIAGNOSIS — G8929 Other chronic pain: Secondary | ICD-10-CM

## 2013-10-06 DIAGNOSIS — I498 Other specified cardiac arrhythmias: Secondary | ICD-10-CM

## 2013-10-06 DIAGNOSIS — I6529 Occlusion and stenosis of unspecified carotid artery: Secondary | ICD-10-CM | POA: Insufficient documentation

## 2013-10-06 DIAGNOSIS — I951 Orthostatic hypotension: Secondary | ICD-10-CM

## 2013-10-06 DIAGNOSIS — I059 Rheumatic mitral valve disease, unspecified: Secondary | ICD-10-CM

## 2013-10-06 DIAGNOSIS — I341 Nonrheumatic mitral (valve) prolapse: Secondary | ICD-10-CM | POA: Insufficient documentation

## 2013-10-06 NOTE — Progress Notes (Signed)
  [address] [place], [place]  [address] Phone: [phone] Fax:  [phone]  Date:  10/06/2013   ID:  CHYNNA BUERKLE, DOB 08/13/1971, MRN [id number]  PCP:  Mickie Hillier, MD  Cardiologist:  Armanda Magic, MD     History of Present Illness: Nicole Wilson is a 42 y.o. female with a history of Down's Syndrome with Moya Moya, bradycardia, Orthorstatic hypotension, MVP who presents today for followup.     Wt Readings from Last 3 Encounters:  10/06/13 144 lb (65.318 kg)     Past Medical History  Diagnosis Date  . Hypothyroidism   . Orthostatic hypotension   . Down's syndrome   . CAD (coronary artery diseas)   . Hidradenitis   . Dyslipidemia   . Allergic rhinitis   . Bradycardia   . Hyperlipidemia   . Carotid artery occlusion     bilateral carotid artery bypass for Moya Moya  . MVP (mitral valve prolapse)     Current Outpatient Prescriptions  Medication Sig Dispense Refill  . fludrocortisone (FLORINEF) 0.1 MG tablet 0.1 mg 2 (two) times daily. 3 tablets by mouth daily      . levothyroxine (SYNTHROID, LEVOTHROID) 50 MCG tablet Take 50 mcg by mouth daily.       . pravastatin (PRAVACHOL) 20 MG tablet Take 20 mg by mouth daily.        No current facility-administered medications for this visit.    Allergies:    Allergies  Allergen Reactions  . Cephalexin Rash  . Dilantin [Phenytoin]     Social History:  The patient  reports that she has never smoked. She does not have any smokeless tobacco history on file. She reports that she does not drink alcohol or use illicit drugs.   Family History:  The patient's family history includes Multiple myeloma in her father.   ROS:  Please see the history of present illness.      All other systems reviewed and negative.   PHYSICAL EXAM: VS:  BP 98/66  Pulse 66  Ht 4\' 10"  (1.473 m)  Wt 144 lb (65.318 kg)  BMI 30.10 kg/m2 Well nourished, well developed, in no acute distress HEENT: normal Neck: no JVD Cardiac:   normal S1, S2; RRR; no murmur Lungs:  clear to auscultation bilaterally, no wheezing, rhonchi or rales Abd: soft, nontender, no hepatomegaly Ext: no edema Skin: warm and dry Neuro:  CNs 2-12 intact, no focal abnormalities noted  EKG:  NSR     ASSESSMENT AND PLAN:  1. Orthostatic hypotension - very well treated with no dizziness or syncope.  - continue Florinef 2. MVP  - check 2D echo 3. Chronic chest wall pain - stable  Followup with me in 1 year  Signed, Armanda Magic, MD 10/06/2013 1:49 PM

## 2013-10-06 NOTE — Patient Instructions (Signed)
Your physician recommends that you continue on your current medications as directed. Please refer to the Current Medication list given to you today.  Your physician has requested that you have an echocardiogram. Echocardiography is a painless test that uses sound waves to create images of your heart. It provides your doctor with information about the size and shape of your heart and how well your heart's chambers and valves are working. This procedure takes approximately one hour. There are no restrictions for this procedure.  Your physician wants you to follow-up in: 1 Year with Dr Turner You will receive a reminder letter in the mail two months in advance. If you don't receive a letter, please call our office to schedule the follow-up appointment.  

## 2013-10-26 ENCOUNTER — Encounter: Payer: Self-pay | Admitting: Cardiovascular Disease

## 2013-10-26 ENCOUNTER — Ambulatory Visit (HOSPITAL_COMMUNITY): Payer: Medicare Other | Attending: Cardiovascular Disease | Admitting: Radiology

## 2013-10-26 DIAGNOSIS — I951 Orthostatic hypotension: Secondary | ICD-10-CM | POA: Diagnosis not present

## 2013-10-26 DIAGNOSIS — I079 Rheumatic tricuspid valve disease, unspecified: Secondary | ICD-10-CM | POA: Insufficient documentation

## 2013-10-26 DIAGNOSIS — R079 Chest pain, unspecified: Secondary | ICD-10-CM | POA: Diagnosis not present

## 2013-10-26 DIAGNOSIS — I059 Rheumatic mitral valve disease, unspecified: Secondary | ICD-10-CM | POA: Diagnosis not present

## 2013-10-26 DIAGNOSIS — E669 Obesity, unspecified: Secondary | ICD-10-CM | POA: Insufficient documentation

## 2013-10-26 DIAGNOSIS — I341 Nonrheumatic mitral (valve) prolapse: Secondary | ICD-10-CM

## 2013-10-26 DIAGNOSIS — Q909 Down syndrome, unspecified: Secondary | ICD-10-CM | POA: Diagnosis not present

## 2013-10-26 DIAGNOSIS — Z6830 Body mass index (BMI) 30.0-30.9, adult: Secondary | ICD-10-CM | POA: Insufficient documentation

## 2013-10-26 DIAGNOSIS — R072 Precordial pain: Secondary | ICD-10-CM | POA: Diagnosis not present

## 2013-10-26 DIAGNOSIS — R001 Bradycardia, unspecified: Secondary | ICD-10-CM

## 2013-10-26 DIAGNOSIS — E785 Hyperlipidemia, unspecified: Secondary | ICD-10-CM | POA: Diagnosis not present

## 2013-10-26 DIAGNOSIS — I679 Cerebrovascular disease, unspecified: Secondary | ICD-10-CM | POA: Diagnosis not present

## 2013-10-26 DIAGNOSIS — I498 Other specified cardiac arrhythmias: Secondary | ICD-10-CM | POA: Insufficient documentation

## 2013-10-26 NOTE — Progress Notes (Signed)
Echocardiogram performed.  

## 2013-10-30 DIAGNOSIS — Q909 Down syndrome, unspecified: Secondary | ICD-10-CM | POA: Diagnosis not present

## 2013-10-30 DIAGNOSIS — H908 Mixed conductive and sensorineural hearing loss, unspecified: Secondary | ICD-10-CM | POA: Diagnosis not present

## 2013-10-30 DIAGNOSIS — H6980 Other specified disorders of Eustachian tube, unspecified ear: Secondary | ICD-10-CM | POA: Diagnosis not present

## 2013-10-30 DIAGNOSIS — H905 Unspecified sensorineural hearing loss: Secondary | ICD-10-CM | POA: Diagnosis not present

## 2013-11-20 DIAGNOSIS — H906 Mixed conductive and sensorineural hearing loss, bilateral: Secondary | ICD-10-CM | POA: Diagnosis not present

## 2013-11-20 DIAGNOSIS — H6530 Chronic mucoid otitis media, unspecified ear: Secondary | ICD-10-CM | POA: Diagnosis not present

## 2013-11-20 DIAGNOSIS — Q909 Down syndrome, unspecified: Secondary | ICD-10-CM | POA: Diagnosis not present

## 2013-11-20 DIAGNOSIS — I675 Moyamoya disease: Secondary | ICD-10-CM | POA: Diagnosis not present

## 2013-11-20 DIAGNOSIS — H905 Unspecified sensorineural hearing loss: Secondary | ICD-10-CM | POA: Diagnosis not present

## 2013-11-20 DIAGNOSIS — H6520 Chronic serous otitis media, unspecified ear: Secondary | ICD-10-CM | POA: Diagnosis not present

## 2013-11-20 DIAGNOSIS — H6980 Other specified disorders of Eustachian tube, unspecified ear: Secondary | ICD-10-CM | POA: Diagnosis not present

## 2013-11-20 DIAGNOSIS — H9319 Tinnitus, unspecified ear: Secondary | ICD-10-CM | POA: Diagnosis not present

## 2013-11-20 DIAGNOSIS — H908 Mixed conductive and sensorineural hearing loss, unspecified: Secondary | ICD-10-CM | POA: Diagnosis not present

## 2013-12-25 DIAGNOSIS — Q909 Down syndrome, unspecified: Secondary | ICD-10-CM | POA: Diagnosis not present

## 2013-12-25 DIAGNOSIS — H905 Unspecified sensorineural hearing loss: Secondary | ICD-10-CM | POA: Diagnosis not present

## 2013-12-25 DIAGNOSIS — H6980 Other specified disorders of Eustachian tube, unspecified ear: Secondary | ICD-10-CM | POA: Diagnosis not present

## 2013-12-25 DIAGNOSIS — H908 Mixed conductive and sensorineural hearing loss, unspecified: Secondary | ICD-10-CM | POA: Diagnosis not present

## 2014-03-09 DIAGNOSIS — M4802 Spinal stenosis, cervical region: Secondary | ICD-10-CM | POA: Diagnosis not present

## 2014-03-09 DIAGNOSIS — R29898 Other symptoms and signs involving the musculoskeletal system: Secondary | ICD-10-CM | POA: Diagnosis not present

## 2014-03-09 DIAGNOSIS — I675 Moyamoya disease: Secondary | ICD-10-CM | POA: Diagnosis not present

## 2014-03-09 DIAGNOSIS — M79609 Pain in unspecified limb: Secondary | ICD-10-CM | POA: Diagnosis not present

## 2014-03-16 DIAGNOSIS — M4802 Spinal stenosis, cervical region: Secondary | ICD-10-CM | POA: Insufficient documentation

## 2014-03-30 DIAGNOSIS — M4802 Spinal stenosis, cervical region: Secondary | ICD-10-CM | POA: Diagnosis not present

## 2014-03-30 DIAGNOSIS — M47812 Spondylosis without myelopathy or radiculopathy, cervical region: Secondary | ICD-10-CM | POA: Diagnosis not present

## 2014-04-04 DIAGNOSIS — H903 Sensorineural hearing loss, bilateral: Secondary | ICD-10-CM | POA: Diagnosis not present

## 2014-04-04 DIAGNOSIS — H6980 Other specified disorders of Eustachian tube, unspecified ear: Secondary | ICD-10-CM | POA: Diagnosis not present

## 2014-04-04 DIAGNOSIS — H9319 Tinnitus, unspecified ear: Secondary | ICD-10-CM | POA: Diagnosis not present

## 2014-04-04 DIAGNOSIS — Q909 Down syndrome, unspecified: Secondary | ICD-10-CM | POA: Diagnosis not present

## 2014-04-04 DIAGNOSIS — H908 Mixed conductive and sensorineural hearing loss, unspecified: Secondary | ICD-10-CM | POA: Diagnosis not present

## 2014-04-13 DIAGNOSIS — I951 Orthostatic hypotension: Secondary | ICD-10-CM | POA: Diagnosis not present

## 2014-04-13 DIAGNOSIS — I6529 Occlusion and stenosis of unspecified carotid artery: Secondary | ICD-10-CM | POA: Diagnosis not present

## 2014-04-13 DIAGNOSIS — E7800 Pure hypercholesterolemia, unspecified: Secondary | ICD-10-CM | POA: Diagnosis not present

## 2014-04-13 DIAGNOSIS — E039 Hypothyroidism, unspecified: Secondary | ICD-10-CM | POA: Diagnosis not present

## 2014-04-13 DIAGNOSIS — Q909 Down syndrome, unspecified: Secondary | ICD-10-CM | POA: Diagnosis not present

## 2014-05-03 DIAGNOSIS — I675 Moyamoya disease: Secondary | ICD-10-CM | POA: Diagnosis not present

## 2014-05-03 DIAGNOSIS — M4802 Spinal stenosis, cervical region: Secondary | ICD-10-CM | POA: Diagnosis not present

## 2014-05-17 ENCOUNTER — Ambulatory Visit: Payer: Medicare Other | Attending: Neurosurgery

## 2014-05-17 DIAGNOSIS — M545 Low back pain, unspecified: Secondary | ICD-10-CM | POA: Diagnosis not present

## 2014-05-17 DIAGNOSIS — M542 Cervicalgia: Secondary | ICD-10-CM | POA: Insufficient documentation

## 2014-05-29 ENCOUNTER — Ambulatory Visit: Payer: Medicare Other

## 2014-05-29 DIAGNOSIS — M542 Cervicalgia: Secondary | ICD-10-CM | POA: Diagnosis not present

## 2014-05-31 ENCOUNTER — Ambulatory Visit: Payer: Medicare Other | Admitting: Physical Therapy

## 2014-06-05 ENCOUNTER — Ambulatory Visit: Payer: Medicare Other | Admitting: Physical Therapy

## 2014-06-05 DIAGNOSIS — M542 Cervicalgia: Secondary | ICD-10-CM | POA: Diagnosis not present

## 2014-06-07 ENCOUNTER — Ambulatory Visit: Payer: Medicare Other | Admitting: Physical Therapy

## 2014-06-07 DIAGNOSIS — M542 Cervicalgia: Secondary | ICD-10-CM | POA: Diagnosis not present

## 2014-06-19 ENCOUNTER — Ambulatory Visit: Payer: Medicare Other | Attending: Neurosurgery | Admitting: Physical Therapy

## 2014-06-19 DIAGNOSIS — M545 Low back pain, unspecified: Secondary | ICD-10-CM | POA: Diagnosis not present

## 2014-06-19 DIAGNOSIS — M542 Cervicalgia: Secondary | ICD-10-CM | POA: Diagnosis not present

## 2014-06-21 ENCOUNTER — Ambulatory Visit: Payer: Medicare Other | Admitting: Physical Therapy

## 2014-06-21 DIAGNOSIS — M25473 Effusion, unspecified ankle: Secondary | ICD-10-CM | POA: Diagnosis not present

## 2014-06-21 DIAGNOSIS — M25579 Pain in unspecified ankle and joints of unspecified foot: Secondary | ICD-10-CM | POA: Diagnosis not present

## 2014-06-21 DIAGNOSIS — M25476 Effusion, unspecified foot: Secondary | ICD-10-CM | POA: Diagnosis not present

## 2014-06-26 ENCOUNTER — Ambulatory Visit: Payer: Medicare Other | Admitting: Physical Therapy

## 2014-06-26 DIAGNOSIS — M542 Cervicalgia: Secondary | ICD-10-CM | POA: Diagnosis not present

## 2014-06-28 ENCOUNTER — Ambulatory Visit: Payer: Medicare Other | Admitting: Physical Therapy

## 2014-06-28 DIAGNOSIS — M542 Cervicalgia: Secondary | ICD-10-CM | POA: Diagnosis not present

## 2014-07-04 DIAGNOSIS — M25579 Pain in unspecified ankle and joints of unspecified foot: Secondary | ICD-10-CM | POA: Diagnosis not present

## 2014-07-04 DIAGNOSIS — M25473 Effusion, unspecified ankle: Secondary | ICD-10-CM | POA: Diagnosis not present

## 2014-07-05 ENCOUNTER — Ambulatory Visit: Payer: Medicare Other

## 2014-07-05 DIAGNOSIS — M542 Cervicalgia: Secondary | ICD-10-CM | POA: Diagnosis not present

## 2014-07-10 ENCOUNTER — Encounter: Payer: Medicare Other | Admitting: Physical Therapy

## 2014-07-12 ENCOUNTER — Encounter: Payer: Medicare Other | Admitting: Physical Therapy

## 2014-08-01 DIAGNOSIS — H52209 Unspecified astigmatism, unspecified eye: Secondary | ICD-10-CM | POA: Diagnosis not present

## 2014-08-01 DIAGNOSIS — H04129 Dry eye syndrome of unspecified lacrimal gland: Secondary | ICD-10-CM | POA: Diagnosis not present

## 2014-08-01 DIAGNOSIS — H269 Unspecified cataract: Secondary | ICD-10-CM | POA: Diagnosis not present

## 2014-08-01 DIAGNOSIS — H18609 Keratoconus, unspecified, unspecified eye: Secondary | ICD-10-CM | POA: Diagnosis not present

## 2014-08-23 ENCOUNTER — Encounter: Payer: Self-pay | Admitting: Cardiology

## 2014-09-05 DIAGNOSIS — Z1231 Encounter for screening mammogram for malignant neoplasm of breast: Secondary | ICD-10-CM | POA: Diagnosis not present

## 2014-09-06 DIAGNOSIS — E039 Hypothyroidism, unspecified: Secondary | ICD-10-CM | POA: Diagnosis not present

## 2014-09-06 DIAGNOSIS — E780 Pure hypercholesterolemia: Secondary | ICD-10-CM | POA: Diagnosis not present

## 2014-09-06 DIAGNOSIS — Q909 Down syndrome, unspecified: Secondary | ICD-10-CM | POA: Diagnosis not present

## 2014-09-06 DIAGNOSIS — Z79899 Other long term (current) drug therapy: Secondary | ICD-10-CM | POA: Diagnosis not present

## 2014-09-06 DIAGNOSIS — Z23 Encounter for immunization: Secondary | ICD-10-CM | POA: Diagnosis not present

## 2014-09-06 DIAGNOSIS — J3089 Other allergic rhinitis: Secondary | ICD-10-CM | POA: Diagnosis not present

## 2014-09-06 DIAGNOSIS — I6529 Occlusion and stenosis of unspecified carotid artery: Secondary | ICD-10-CM | POA: Diagnosis not present

## 2014-09-06 DIAGNOSIS — Z0000 Encounter for general adult medical examination without abnormal findings: Secondary | ICD-10-CM | POA: Diagnosis not present

## 2014-09-19 ENCOUNTER — Encounter: Payer: Self-pay | Admitting: Cardiology

## 2014-11-20 DIAGNOSIS — H6983 Other specified disorders of Eustachian tube, bilateral: Secondary | ICD-10-CM | POA: Diagnosis not present

## 2014-11-20 DIAGNOSIS — H9313 Tinnitus, bilateral: Secondary | ICD-10-CM | POA: Diagnosis not present

## 2014-11-20 DIAGNOSIS — Q909 Down syndrome, unspecified: Secondary | ICD-10-CM | POA: Diagnosis not present

## 2014-11-20 DIAGNOSIS — H903 Sensorineural hearing loss, bilateral: Secondary | ICD-10-CM | POA: Diagnosis not present

## 2014-12-28 ENCOUNTER — Ambulatory Visit: Payer: Medicare Other | Admitting: Cardiology

## 2015-01-15 DIAGNOSIS — M542 Cervicalgia: Secondary | ICD-10-CM | POA: Diagnosis not present

## 2015-01-15 DIAGNOSIS — M545 Low back pain: Secondary | ICD-10-CM | POA: Diagnosis not present

## 2015-01-24 DIAGNOSIS — M47816 Spondylosis without myelopathy or radiculopathy, lumbar region: Secondary | ICD-10-CM | POA: Diagnosis not present

## 2015-02-14 DIAGNOSIS — Z6828 Body mass index (BMI) 28.0-28.9, adult: Secondary | ICD-10-CM | POA: Diagnosis not present

## 2015-02-14 DIAGNOSIS — M545 Low back pain: Secondary | ICD-10-CM | POA: Diagnosis not present

## 2015-02-14 DIAGNOSIS — M5134 Other intervertebral disc degeneration, thoracic region: Secondary | ICD-10-CM | POA: Diagnosis not present

## 2015-02-14 DIAGNOSIS — M546 Pain in thoracic spine: Secondary | ICD-10-CM | POA: Diagnosis not present

## 2015-02-14 DIAGNOSIS — R202 Paresthesia of skin: Secondary | ICD-10-CM | POA: Diagnosis not present

## 2015-02-14 DIAGNOSIS — M4806 Spinal stenosis, lumbar region: Secondary | ICD-10-CM | POA: Diagnosis not present

## 2015-02-14 DIAGNOSIS — M4726 Other spondylosis with radiculopathy, lumbar region: Secondary | ICD-10-CM | POA: Diagnosis not present

## 2015-02-14 DIAGNOSIS — M5489 Other dorsalgia: Secondary | ICD-10-CM | POA: Diagnosis not present

## 2015-02-14 DIAGNOSIS — M542 Cervicalgia: Secondary | ICD-10-CM | POA: Diagnosis not present

## 2015-02-14 DIAGNOSIS — M4316 Spondylolisthesis, lumbar region: Secondary | ICD-10-CM | POA: Diagnosis not present

## 2015-02-14 DIAGNOSIS — M47814 Spondylosis without myelopathy or radiculopathy, thoracic region: Secondary | ICD-10-CM | POA: Diagnosis not present

## 2015-02-14 DIAGNOSIS — M5136 Other intervertebral disc degeneration, lumbar region: Secondary | ICD-10-CM | POA: Diagnosis not present

## 2015-02-20 NOTE — Progress Notes (Signed)
Cardiology Office Note   Date:  02/21/2015   ID:  Nicole Wilson, DOB 1971-01-15, MRN [id number]  PCP:  Nicole Pac, MD    Chief Complaint  Patient presents with  . Hypotension  . Mitral Valve Prolapse  . Bradycardia      History of Present Illness: Nicole Wilson is a 43 y.o. female with a history of Down's Syndrome with Moya Moya, bradycardia, Orthostatic hypotension, MVP who presents today for followup. She is doing well.  She denies any anginal chest pain, SOB, DOE, LE edema,  palpitations or syncope.  She occasionally will have some dizziness that she describes as the room spinning.  She has chronic chest wall pain which is stable.       Past Medical History  Diagnosis Date  . Hypothyroidism   . Orthostatic hypotension   . Down's syndrome   . CAD (coronary artery disease)   . Hidradenitis   . Dyslipidemia   . Allergic rhinitis   . Bradycardia   . Hyperlipidemia   . Carotid artery occlusion     bilateral carotid artery bypass for Moya Moya  . MVP (mitral valve prolapse)   . Cervical spondylosis     Past Surgical History  Procedure Laterality Date  . Tonsillectomy and adenoidectomy    . Middle ear surgery    . Carotid artery surgery       Current Outpatient Prescriptions  Medication Sig Dispense Refill  . levothyroxine (SYNTHROID, LEVOTHROID) 50 MCG tablet Take 50 mcg by mouth daily.     . Multiple Vitamin (MULTIVITAMIN) tablet Take 1 tablet by mouth daily.    . pravastatin (PRAVACHOL) 40 MG tablet Take 40 mg by mouth daily.    . fludrocortisone (FLORINEF) 0.1 MG tablet Take 1 tablet (0.1 mg total) by mouth daily. 30 tablet 1   No current facility-administered medications for this visit.    Allergies:   Cephalexin and Dilantin    Social History:  The patient  reports that she has never smoked. She does not have any smokeless tobacco history on file. She reports that she does not drink alcohol or use illicit drugs.   Family History:  The  patient's family history includes Multiple myeloma in her father; Prostate cancer in her father.    ROS:  Please see the history of present illness.   Otherwise, review of systems are positive for none.   All other systems are reviewed and negative.    PHYSICAL EXAM: VS:  BP 94/65 mmHg  Pulse 62  Ht 4' 10"  (1.473 m)  Wt 138 lb 9.6 oz (62.869 kg)  BMI 28.98 kg/m2  LMP 01/21/2015 , BMI Body mass index is 28.98 kg/(m^2). GEN: Well nourished, well developed, in no acute distress HEENT: normal Neck: no JVD, carotid bruits, or masses Cardiac: RRR; no murmurs, rubs, or gallops,no edema  Respiratory:  clear to auscultation bilaterally, normal work of breathing GI: soft, nontender, nondistended, + BS MS: no deformity or atrophy Skin: warm and dry, no rash Neuro:  Strength and sensation are intact Psych: euthymic mood, full affect  EKG:  EKG was ordered today showing NSR with no ST changes    Recent Labs: No results found for requested labs within last 365 days.    Lipid Panel No results found for: CHOL, TRIG, HDL, CHOLHDL, VLDL, LDLCALC, LDLDIRECT    Wt Readings from Last 3 Encounters:  02/21/15 138 lb 9.6 oz (62.869 kg)  10/06/13 144 lb (65.318 kg)  ASSESSMENT AND PLAN:  1.  Orthostatic hypotension - very well treated with no syncope.She has some dizziness but no syncope.  For some reason she is not taking florinef any more.  Her mom says that Nicole Wilson takes her own meds and does not know why she is not taking it anymore. I will restart Florinef 0.108m daily due to persistently low BP with some dizziness.  She will followup with the PA in 4 weeks to make sure she is tolerating it.  2.  MVP - mild by echo 2 years ago.  Recheck echo 10/2015   3.  Chronic chest wall pain - stable   Current medicines are reviewed at length with the patient today.  The patient does not have concerns regarding medicines.  The following changes have been made:  no change  Labs/ tests ordered today  include: see above assessment and plan  Orders Placed This Encounter  Procedures  . EKG 12-Lead  . 2D Echocardiogram with contrast     Disposition:   FU with me in 1 year   Signed, TSueanne Margarita MD  02/21/2015 11:34 AM    [facility] [address] [place]  [address]Phone: [phone] Fax: [phone]

## 2015-02-21 ENCOUNTER — Encounter: Payer: Self-pay | Admitting: *Deleted

## 2015-02-21 ENCOUNTER — Encounter: Payer: Self-pay | Admitting: Cardiology

## 2015-02-21 ENCOUNTER — Ambulatory Visit (INDEPENDENT_AMBULATORY_CARE_PROVIDER_SITE_OTHER): Payer: Medicare Other | Admitting: Cardiology

## 2015-02-21 VITALS — BP 94/65 | HR 62 | Ht <= 58 in | Wt 138.6 lb

## 2015-02-21 DIAGNOSIS — I341 Nonrheumatic mitral (valve) prolapse: Secondary | ICD-10-CM

## 2015-02-21 DIAGNOSIS — G8929 Other chronic pain: Secondary | ICD-10-CM

## 2015-02-21 DIAGNOSIS — I951 Orthostatic hypotension: Secondary | ICD-10-CM | POA: Diagnosis not present

## 2015-02-21 DIAGNOSIS — Q909 Down syndrome, unspecified: Secondary | ICD-10-CM | POA: Diagnosis not present

## 2015-02-21 DIAGNOSIS — R202 Paresthesia of skin: Secondary | ICD-10-CM | POA: Diagnosis not present

## 2015-02-21 DIAGNOSIS — I675 Moyamoya disease: Secondary | ICD-10-CM | POA: Diagnosis not present

## 2015-02-21 DIAGNOSIS — R0789 Other chest pain: Secondary | ICD-10-CM | POA: Diagnosis not present

## 2015-02-21 MED ORDER — FLUDROCORTISONE ACETATE 0.1 MG PO TABS: 0.1000 mg | ORAL_TABLET | Freq: Every day | ORAL | Status: DC

## 2015-02-21 NOTE — Patient Instructions (Addendum)
Start Florinef 0.1mg  daily.  Your physician recommends that you schedule a follow-up appointment in: 1 month with PA/NP.  Your physician has requested that you have an echocardiogram. Echocardiography is a painless test that uses sound waves to create images of your heart. It provides your doctor with information about the size and shape of your heart and how well your heart's chambers and valves are working. This procedure takes approximately one hour. There are no restrictions for this procedure. December 2016  Your physician wants you to follow-up in: 1 year with Dr Radford Pax. (April 2017).  You will receive a reminder letter in the mail two months in advance. If you don't receive a letter, please call our office to schedule the follow-up appointment.

## 2015-03-01 DIAGNOSIS — M4806 Spinal stenosis, lumbar region: Secondary | ICD-10-CM | POA: Diagnosis not present

## 2015-03-01 DIAGNOSIS — M5136 Other intervertebral disc degeneration, lumbar region: Secondary | ICD-10-CM | POA: Diagnosis not present

## 2015-03-01 DIAGNOSIS — M4726 Other spondylosis with radiculopathy, lumbar region: Secondary | ICD-10-CM | POA: Diagnosis not present

## 2015-03-01 DIAGNOSIS — R202 Paresthesia of skin: Secondary | ICD-10-CM | POA: Diagnosis not present

## 2015-03-01 DIAGNOSIS — Z6827 Body mass index (BMI) 27.0-27.9, adult: Secondary | ICD-10-CM | POA: Diagnosis not present

## 2015-03-01 DIAGNOSIS — M4316 Spondylolisthesis, lumbar region: Secondary | ICD-10-CM | POA: Diagnosis not present

## 2015-03-13 DIAGNOSIS — Q909 Down syndrome, unspecified: Secondary | ICD-10-CM | POA: Diagnosis not present

## 2015-03-13 DIAGNOSIS — R51 Headache: Secondary | ICD-10-CM | POA: Diagnosis not present

## 2015-03-13 DIAGNOSIS — M79602 Pain in left arm: Secondary | ICD-10-CM | POA: Diagnosis not present

## 2015-03-13 DIAGNOSIS — Z9889 Other specified postprocedural states: Secondary | ICD-10-CM | POA: Diagnosis not present

## 2015-03-13 DIAGNOSIS — Z09 Encounter for follow-up examination after completed treatment for conditions other than malignant neoplasm: Secondary | ICD-10-CM | POA: Diagnosis not present

## 2015-03-13 DIAGNOSIS — R200 Anesthesia of skin: Secondary | ICD-10-CM | POA: Diagnosis not present

## 2015-03-27 NOTE — Progress Notes (Signed)
Cardiology Office Note   Date:  03/28/2015   ID:  Nicole Wilson, DOB 1971-08-05, MRN [id number]  PCP:  Gennette Pac, MD  Cardiologist:  Dr. Fransico Him     Chief Complaint  Patient presents with  . Orthostatic Hypotension     History of Present Illness: Nicole Wilson is a 44 y.o. female with a hx of Down's Syndrome with Moya Moya, bradycardia, Orthostatic hypotension, MVP, chronic chest wall pain.   Last seen by Dr. Fransico Him 02/2015.  She was off Florinef for unclear reasons.  BP was low and Florinef was resumed.  She returns for FU.  Since last seen, she is doing well. She did have a couple of episodes of confusion. She's also had a lot of paresthesias on the left side of her body. She sees a Publishing rights manager and has some lumbar disc disease. She is set up to see a neurologist at [facility] next week. She denies syncope. She denies chest pain or significant dyspnea. She denies orthopnea, PND or edema.   Studies/Reports Reviewed Today:  Echo 10/26/13 - EF 55% to 60%. Wall motion was normal;  - Mitral valve: Mild, late systolicprolapse, involving the anterior leaflet.   Past Medical History  Diagnosis Date  . Hypothyroidism   . Orthostatic hypotension   . Down's syndrome   . CAD (coronary artery disease)   . Hidradenitis   . Dyslipidemia   . Allergic rhinitis   . Bradycardia   . Hyperlipidemia   . Carotid artery occlusion     bilateral carotid artery bypass for Moya Moya  . MVP (mitral valve prolapse)   . Cervical spondylosis     Past Surgical History  Procedure Laterality Date  . Tonsillectomy and adenoidectomy    . Middle ear surgery    . Carotid artery surgery       Current Outpatient Prescriptions  Medication Sig Dispense Refill  . fludrocortisone (FLORINEF) 0.1 MG tablet Take 1 tablet (0.1 mg total) by mouth daily. 30 tablet 1  . levothyroxine (SYNTHROID, LEVOTHROID) 50 MCG tablet Take 50 mcg by mouth daily.     . Multiple  Vitamin (MULTIVITAMIN) tablet Take 1 tablet by mouth daily.    . pravastatin (PRAVACHOL) 40 MG tablet Take 40 mg by mouth daily.     No current facility-administered medications for this visit.    Allergies:   Cephalexin and Dilantin    Social History:  The patient  reports that she has never smoked. She does not have any smokeless tobacco history on file. She reports that she does not drink alcohol or use illicit drugs.   Family History:  The patient's family history includes Multiple myeloma in her father; Prostate cancer in her father. There is no history of Heart attack or Stroke.    ROS:   Please see the history of present illness.   Review of Systems  HENT: Positive for headaches.   All other systems reviewed and are negative.    PHYSICAL EXAM: VS:  BP 100/70 mmHg  Pulse 49  Ht 4' 10"  (1.473 m)  Wt 138 lb (62.596 kg)  BMI 28.85 kg/m2    Wt Readings from Last 3 Encounters:  03/28/15 138 lb (62.596 kg)  02/21/15 138 lb 9.6 oz (62.869 kg)  10/06/13 144 lb (65.318 kg)     GEN: Well nourished, well developed, in no acute distress HEENT: normal Neck: no JVD, no masses Cardiac:  Normal S1/S2, RRR; no murmur,  no  rubs or gallops, no edema  Respiratory:  clear to auscultation bilaterally, no wheezing, rhonchi or rales. GI: soft, nontender, nondistended, + BS MS: no deformity or atrophy Skin: warm and dry  Neuro:  CNs II-XII intact, Strength and sensation are intact Psych: Normal affect   EKG:  EKG is ordered today.  It demonstrates:   NSR, HR 49, normal axis   Recent Labs: No results found for requested labs within last 365 days.    Lipid Panel No results found for: CHOL, TRIG, HDL, CHOLHDL, VLDL, LDLCALC, LDLDIRECT    ASSESSMENT AND PLAN:  Orthostatic hypotension She denies symptoms of orthostatic intolerance. She seems to be tolerating Florinef. Blood pressure is improved. Check BMET today.  MVP (mitral valve prolapse) She has a follow-up echocardiogram  arranged in December.  Chronic chest wall pain  She denies chest pain currently.  Hyperlipidemia Managed by primary care. Continue statin.  Bradycardia  She had a monitor placed in 2006. She had heart rates as low as 52 at that time. I am not certain that her recent episodes of confusion are related to bradycardia. If she has recurrent episodes, I would consider an event monitor. I will have her come back in 3 months to revisit her symptoms. She knows to contact us sooner if she has recurrent symptoms. For now, I agree with pursuing workup with neurology.   Current medicines are reviewed at length with the patient today.  Concerns regarding medicines are as outlined above.  The following changes have been made:    None    Labs/ tests ordered today include:  Orders Placed This Encounter  Procedures  . Basic Metabolic Panel (BMET)  . EKG 12-Lead    Disposition:   FU with Dr. Fransico Him or me 3 mos.   Signed, Versie Starks, MHS 03/28/2015 10:48 AM    [facility] [place], [place], [place]  [address] Phone: [phone]; Fax: [phone]

## 2015-03-28 ENCOUNTER — Encounter: Payer: Self-pay | Admitting: Physician Assistant

## 2015-03-28 ENCOUNTER — Ambulatory Visit (INDEPENDENT_AMBULATORY_CARE_PROVIDER_SITE_OTHER): Payer: Medicare Other | Admitting: Physician Assistant

## 2015-03-28 ENCOUNTER — Telehealth: Payer: Self-pay | Admitting: *Deleted

## 2015-03-28 VITALS — BP 100/70 | HR 49 | Ht <= 58 in | Wt 138.0 lb

## 2015-03-28 DIAGNOSIS — R001 Bradycardia, unspecified: Secondary | ICD-10-CM

## 2015-03-28 DIAGNOSIS — I951 Orthostatic hypotension: Secondary | ICD-10-CM | POA: Diagnosis not present

## 2015-03-28 DIAGNOSIS — I341 Nonrheumatic mitral (valve) prolapse: Secondary | ICD-10-CM | POA: Diagnosis not present

## 2015-03-28 DIAGNOSIS — G8929 Other chronic pain: Secondary | ICD-10-CM

## 2015-03-28 DIAGNOSIS — E785 Hyperlipidemia, unspecified: Secondary | ICD-10-CM

## 2015-03-28 DIAGNOSIS — R0789 Other chest pain: Secondary | ICD-10-CM | POA: Diagnosis not present

## 2015-03-28 LAB — BASIC METABOLIC PANEL
BUN: 14 mg/dL (ref 6–23)
CALCIUM: 8.9 mg/dL (ref 8.4–10.5)
CO2: 29 mEq/L (ref 19–32)
CREATININE: 0.78 mg/dL (ref 0.40–1.20)
Chloride: 106 mEq/L (ref 96–112)
GFR: 85.26 mL/min (ref >60.00)
Glucose, Bld: 81 mg/dL (ref 70–99)
Potassium: 3.9 mEq/L (ref 3.5–5.1)
SODIUM: 138 meq/L (ref 135–145)

## 2015-03-28 NOTE — Telephone Encounter (Signed)
s/w pt's mother Hoyle Sauer due to pt has Down Syndrome. Mother notified lab work today normal by phone with verbal understanding.

## 2015-03-28 NOTE — Patient Instructions (Signed)
Medication Instructions:  Your physician recommends that you continue on your current medications as directed. Please refer to the Current Medication list given to you today.   Labwork: TODAY BMET   Testing/Procedures: NONE  Follow-Up: FOLLOW UP WITH SCOTT WEAVER, [facility] 06/27/15 @ 8:50 SAME DAY DR. Radford Pax IS IN THE OFFICE  Any Other Special Instructions Will Be Listed Below (If Applicable).

## 2015-04-11 DIAGNOSIS — Z8673 Personal history of transient ischemic attack (TIA), and cerebral infarction without residual deficits: Secondary | ICD-10-CM | POA: Diagnosis not present

## 2015-04-11 DIAGNOSIS — I675 Moyamoya disease: Secondary | ICD-10-CM | POA: Diagnosis not present

## 2015-04-11 DIAGNOSIS — D259 Leiomyoma of uterus, unspecified: Secondary | ICD-10-CM | POA: Diagnosis not present

## 2015-04-11 DIAGNOSIS — Q909 Down syndrome, unspecified: Secondary | ICD-10-CM | POA: Diagnosis not present

## 2015-04-11 DIAGNOSIS — Z888 Allergy status to other drugs, medicaments and biological substances status: Secondary | ICD-10-CM | POA: Diagnosis not present

## 2015-04-11 DIAGNOSIS — M4802 Spinal stenosis, cervical region: Secondary | ICD-10-CM | POA: Diagnosis not present

## 2015-04-11 DIAGNOSIS — G5602 Carpal tunnel syndrome, left upper limb: Secondary | ICD-10-CM | POA: Diagnosis not present

## 2015-04-11 DIAGNOSIS — Z79899 Other long term (current) drug therapy: Secondary | ICD-10-CM | POA: Diagnosis not present

## 2015-04-11 DIAGNOSIS — M79602 Pain in left arm: Secondary | ICD-10-CM | POA: Diagnosis not present

## 2015-04-19 DIAGNOSIS — Z1231 Encounter for screening mammogram for malignant neoplasm of breast: Secondary | ICD-10-CM | POA: Diagnosis not present

## 2015-06-19 DIAGNOSIS — H903 Sensorineural hearing loss, bilateral: Secondary | ICD-10-CM | POA: Diagnosis not present

## 2015-06-19 DIAGNOSIS — H6983 Other specified disorders of Eustachian tube, bilateral: Secondary | ICD-10-CM | POA: Diagnosis not present

## 2015-06-19 DIAGNOSIS — H9313 Tinnitus, bilateral: Secondary | ICD-10-CM | POA: Diagnosis not present

## 2015-06-19 DIAGNOSIS — Q909 Down syndrome, unspecified: Secondary | ICD-10-CM | POA: Diagnosis not present

## 2015-06-27 ENCOUNTER — Ambulatory Visit: Payer: Medicare Other | Admitting: Physician Assistant

## 2015-07-14 NOTE — Progress Notes (Signed)
Cardiology Office Note   Date:  07/15/2015   ID:  Nicole Wilson, DOB 05/04/1971, MRN [id number]  PCP:  Gennette Pac, MD  Cardiologist:  Dr. Fransico Him     Chief Complaint  Patient presents with  . Follow-up    bradycardia, orthostatic hypotension     History of Present Illness: Nicole Wilson is a 44 y.o. female with a hx of Down's Syndrome with Moya Moya, bradycardia, Orthostatic hypotension controlled with Florinef, MVP, chronic chest wall pain.    She has a lot of paresthesias on the left side of her body. She sees a Publishing rights manager and has some lumbar disc disease.    I saw her in follow-up 03/28/15.   She reported a couple of episodes of confusion. She was noted to be bradycardiac with a heart rate of 49. It was not certain that her symptoms are related to bradycardia. I recommended an event monitor if she had recurrent symptoms. She returns for follow-up today.  She returns for FU.  Here with her mother. She has occasional epigastric pain with palpation.  No substernal chest pain.  No syncope. No orthopnea, PND, edema.  She is going to have lumbar spine surgery at some point. She continues to have occasional episodes of very brief confusion. This is not any worse.     Studies/Reports Reviewed Today:  Echo 10/26/13 - EF 55% to 60%. Wall motion was normal;  - Mitral valve: Mild, late systolicprolapse, involving the anterior leaflet.   Past Medical History  Diagnosis Date  . Hypothyroidism   . Orthostatic hypotension   . Down's syndrome   . CAD (coronary artery disease)   . Hidradenitis   . Dyslipidemia   . Allergic rhinitis   . Bradycardia   . Hyperlipidemia   . Carotid artery occlusion     bilateral carotid artery bypass for Moya Moya  . MVP (mitral valve prolapse)   . Cervical spondylosis     Past Surgical History  Procedure Laterality Date  . Tonsillectomy and adenoidectomy    . Middle ear surgery    . Carotid artery surgery       Current  Outpatient Prescriptions  Medication Sig Dispense Refill  . fludrocortisone (FLORINEF) 0.1 MG tablet Take 1 tablet (0.1 mg total) by mouth daily.    Marland Kitchen levothyroxine (SYNTHROID, LEVOTHROID) 50 MCG tablet Take 50 mcg by mouth daily.     . Multiple Vitamin (MULTIVITAMIN) tablet Take 1 tablet by mouth daily.    . pravastatin (PRAVACHOL) 40 MG tablet Take 40 mg by mouth daily.     No current facility-administered medications for this visit.    Allergies:   Cephalexin and Dilantin    Social History:  The patient  reports that she has never smoked. She does not have any smokeless tobacco history on file. She reports that she does not drink alcohol or use illicit drugs.   Family History:  The patient's family history includes Multiple myeloma in her father; Prostate cancer in her father. There is no history of Heart attack or Stroke.    ROS:   Please see the history of present illness.   Review of Systems  Cardiovascular: Positive for chest pain.  All other systems reviewed and are negative.    PHYSICAL EXAM: VS:  BP 110/56 mmHg  Pulse 64  Ht _0  (1.473 m)  Wt 137 lb (62.143 kg)  BMI 28.64 kg/m2  SpO2 98%    Wt Readings from Last 3 Encounters:  07/15/15 137 lb (62.143 kg)  03/28/15 138 lb (62.596 kg)  02/21/15 138 lb 9.6 oz (62.869 kg)     GEN: Well nourished, well developed, in no acute distress HEENT: normal Neck: no JVD, no masses Cardiac:  Normal S1/S2, RRR; no murmur,  no rubs or gallops, no edema  Chest:  + epigastric pain with palpation Respiratory:  clear to auscultation bilaterally, no wheezing, rhonchi or rales. GI: soft, nontender, nondistended, + BS MS: no deformity or atrophy Skin: warm and dry  Neuro:  CNs II-XII intact, Strength and sensation are intact Psych: Normal affect   EKG:  EKG is not  ordered today.  It demonstrates:   n/a   Recent Labs: 03/28/2015: BUN 14; Creatinine, Ser 0.78; Potassium 3.9; Sodium 138    Lipid Panel No results found  for: CHOL, TRIG, HDL, CHOLHDL, VLDL, LDLCALC, LDLDIRECT    ASSESSMENT AND PLAN:  Orthostatic hypotension:  BP stable. She is again off of Florinef.  Given her hx of orthostatic hypotension, I have suggested she resume Florinef 01. Mg QD.      MVP (mitral valve prolapse):  She has a follow-up echocardiogram arranged in December.  Chronic chest wall pain:  Stable.  No further workup.    Hyperlipidemia:  Managed by primary care. Continue statin.  Bradycardia:  She had a monitor placed in 2006. She had heart rates as low as 52 at that time. She continues to have episodes of confusion.  It is hard to know if this is near syncope or dizziness vs something else.  Her HR was 49 at last visit.  I will get an Event Monitor to r/o significant bradyarrhythmias.      Medication Changes: Current medicines are reviewed at length with the patient today.  Concerns regarding medicines are as outlined above.  The following changes have been made:   Discontinued Medications   FLUDROCORTISONE (FLORINEF) 0.1 MG TABLET    Take 1 tablet (0.1 mg total) by mouth daily.   New Prescriptions   FLUDROCORTISONE (FLORINEF) 0.1 MG TABLET    Take 1 tablet (0.1 mg total) by mouth daily.   Modified Medications   No medications on file    Labs/ tests ordered today include:  Orders Placed This Encounter  Procedures  . Cardiac event monitor     Disposition:    FU with Dr. Fransico Him 02/2016 or sooner if monitor is abnormal.      Signed, Richardson Dopp, PA-C, MHS 07/15/2015 3:43 PM    [facility] [place], [place], [place]  [address] Phone: [phone]; Fax: [phone]

## 2015-07-15 ENCOUNTER — Encounter: Payer: Self-pay | Admitting: Physician Assistant

## 2015-07-15 ENCOUNTER — Ambulatory Visit (INDEPENDENT_AMBULATORY_CARE_PROVIDER_SITE_OTHER): Payer: Medicare Other | Admitting: Physician Assistant

## 2015-07-15 VITALS — BP 110/56 | HR 64 | Ht <= 58 in | Wt 137.0 lb

## 2015-07-15 DIAGNOSIS — I341 Nonrheumatic mitral (valve) prolapse: Secondary | ICD-10-CM | POA: Diagnosis not present

## 2015-07-15 DIAGNOSIS — R0789 Other chest pain: Secondary | ICD-10-CM

## 2015-07-15 DIAGNOSIS — I951 Orthostatic hypotension: Secondary | ICD-10-CM | POA: Diagnosis not present

## 2015-07-15 DIAGNOSIS — R001 Bradycardia, unspecified: Secondary | ICD-10-CM | POA: Diagnosis not present

## 2015-07-15 DIAGNOSIS — G8929 Other chronic pain: Secondary | ICD-10-CM

## 2015-07-15 DIAGNOSIS — R42 Dizziness and giddiness: Secondary | ICD-10-CM | POA: Diagnosis not present

## 2015-07-15 DIAGNOSIS — E785 Hyperlipidemia, unspecified: Secondary | ICD-10-CM

## 2015-07-15 MED ORDER — FLUDROCORTISONE ACETATE 0.1 MG PO TABS: 0.1000 mg | ORAL_TABLET | Freq: Every day | ORAL | Status: DC

## 2015-07-15 NOTE — Patient Instructions (Signed)
Medication Instructions:  RESTART FLORINEF 01 MG 1 TABLET DAILY  Labwork: NONE  Testing/Procedures: 1. Your physician has recommended that you wear an 21 DAYS event monitor. Event monitors are medical devices that record the heart's electrical activity. Doctors most often [place] these monitors to diagnose arrhythmias. Arrhythmias are problems with the speed or rhythm of the heartbeat. The monitor is a small, portable device. You can wear one while you do your normal daily activities. This is usually used to diagnose what is causing palpitations/syncope (passing out).  2. GET ECHO IN 10/2015 AS PLANNED  Follow-Up: 02/2016 WITH DR. Radford Pax  Any Other Special Instructions Will Be Listed Below (If Applicable).

## 2015-07-26 DIAGNOSIS — S139XXA Sprain of joints and ligaments of unspecified parts of neck, initial encounter: Secondary | ICD-10-CM | POA: Diagnosis not present

## 2015-07-29 ENCOUNTER — Ambulatory Visit (INDEPENDENT_AMBULATORY_CARE_PROVIDER_SITE_OTHER): Payer: Medicare Other

## 2015-07-29 DIAGNOSIS — R42 Dizziness and giddiness: Secondary | ICD-10-CM | POA: Diagnosis not present

## 2015-07-29 DIAGNOSIS — R001 Bradycardia, unspecified: Secondary | ICD-10-CM

## 2015-08-06 ENCOUNTER — Telehealth: Payer: Self-pay | Admitting: *Deleted

## 2015-08-06 DIAGNOSIS — Z0000 Encounter for general adult medical examination without abnormal findings: Secondary | ICD-10-CM | POA: Diagnosis not present

## 2015-08-06 NOTE — Telephone Encounter (Signed)
Patients father had called in regarding problem with [employer] monitor.  LM to remove battery from recorder, reinsert battery, making sure cell phone is within 3 ft of monitor.  If that doesn't work Research officer, trade union directly.  Their number is on the back of the cell phone.

## 2015-09-20 DIAGNOSIS — Z23 Encounter for immunization: Secondary | ICD-10-CM | POA: Diagnosis not present

## 2015-09-27 DIAGNOSIS — Z9889 Other specified postprocedural states: Secondary | ICD-10-CM | POA: Diagnosis not present

## 2015-09-27 DIAGNOSIS — M5126 Other intervertebral disc displacement, lumbar region: Secondary | ICD-10-CM | POA: Diagnosis not present

## 2015-09-27 DIAGNOSIS — M5412 Radiculopathy, cervical region: Secondary | ICD-10-CM | POA: Diagnosis not present

## 2015-09-27 DIAGNOSIS — M4802 Spinal stenosis, cervical region: Secondary | ICD-10-CM | POA: Diagnosis not present

## 2015-10-08 DIAGNOSIS — M4802 Spinal stenosis, cervical region: Secondary | ICD-10-CM | POA: Diagnosis not present

## 2015-10-08 DIAGNOSIS — M5412 Radiculopathy, cervical region: Secondary | ICD-10-CM | POA: Diagnosis not present

## 2015-10-08 DIAGNOSIS — M47812 Spondylosis without myelopathy or radiculopathy, cervical region: Secondary | ICD-10-CM | POA: Diagnosis not present

## 2015-10-08 DIAGNOSIS — M47892 Other spondylosis, cervical region: Secondary | ICD-10-CM | POA: Diagnosis not present

## 2015-10-21 ENCOUNTER — Other Ambulatory Visit (HOSPITAL_COMMUNITY): Payer: Self-pay

## 2015-10-31 DIAGNOSIS — M4806 Spinal stenosis, lumbar region: Secondary | ICD-10-CM | POA: Diagnosis not present

## 2015-10-31 DIAGNOSIS — M4302 Spondylolysis, cervical region: Secondary | ICD-10-CM | POA: Diagnosis not present

## 2015-10-31 DIAGNOSIS — M5126 Other intervertebral disc displacement, lumbar region: Secondary | ICD-10-CM | POA: Diagnosis not present

## 2015-10-31 DIAGNOSIS — M4316 Spondylolisthesis, lumbar region: Secondary | ICD-10-CM | POA: Diagnosis not present

## 2015-11-04 ENCOUNTER — Ambulatory Visit (HOSPITAL_COMMUNITY): Payer: Medicare Other | Attending: Cardiovascular Disease

## 2015-11-04 ENCOUNTER — Other Ambulatory Visit: Payer: Self-pay

## 2015-11-04 DIAGNOSIS — Q909 Down syndrome, unspecified: Secondary | ICD-10-CM | POA: Insufficient documentation

## 2015-11-04 DIAGNOSIS — I340 Nonrheumatic mitral (valve) insufficiency: Secondary | ICD-10-CM | POA: Diagnosis not present

## 2015-11-04 DIAGNOSIS — E785 Hyperlipidemia, unspecified: Secondary | ICD-10-CM | POA: Insufficient documentation

## 2015-11-04 DIAGNOSIS — I313 Pericardial effusion (noninflammatory): Secondary | ICD-10-CM | POA: Diagnosis not present

## 2015-11-04 DIAGNOSIS — G8929 Other chronic pain: Secondary | ICD-10-CM | POA: Diagnosis not present

## 2015-11-04 DIAGNOSIS — E039 Hypothyroidism, unspecified: Secondary | ICD-10-CM | POA: Insufficient documentation

## 2015-11-04 DIAGNOSIS — R001 Bradycardia, unspecified: Secondary | ICD-10-CM | POA: Insufficient documentation

## 2015-11-04 DIAGNOSIS — I341 Nonrheumatic mitral (valve) prolapse: Secondary | ICD-10-CM

## 2015-11-04 DIAGNOSIS — R0789 Other chest pain: Secondary | ICD-10-CM

## 2015-11-04 DIAGNOSIS — I951 Orthostatic hypotension: Secondary | ICD-10-CM | POA: Diagnosis not present

## 2015-11-04 DIAGNOSIS — R079 Chest pain, unspecified: Secondary | ICD-10-CM | POA: Diagnosis present

## 2015-11-21 DIAGNOSIS — Q909 Down syndrome, unspecified: Secondary | ICD-10-CM | POA: Diagnosis not present

## 2015-11-21 DIAGNOSIS — H9313 Tinnitus, bilateral: Secondary | ICD-10-CM | POA: Diagnosis not present

## 2015-11-21 DIAGNOSIS — H6983 Other specified disorders of Eustachian tube, bilateral: Secondary | ICD-10-CM | POA: Diagnosis not present

## 2015-11-21 DIAGNOSIS — H903 Sensorineural hearing loss, bilateral: Secondary | ICD-10-CM | POA: Diagnosis not present

## 2015-11-25 DIAGNOSIS — M4317 Spondylolisthesis, lumbosacral region: Secondary | ICD-10-CM | POA: Insufficient documentation

## 2015-11-25 DIAGNOSIS — E785 Hyperlipidemia, unspecified: Secondary | ICD-10-CM | POA: Insufficient documentation

## 2015-11-27 DIAGNOSIS — M62830 Muscle spasm of back: Secondary | ICD-10-CM | POA: Diagnosis present

## 2015-11-27 DIAGNOSIS — M4802 Spinal stenosis, cervical region: Secondary | ICD-10-CM | POA: Diagnosis not present

## 2015-11-27 DIAGNOSIS — E039 Hypothyroidism, unspecified: Secondary | ICD-10-CM | POA: Diagnosis present

## 2015-11-27 DIAGNOSIS — M545 Low back pain: Secondary | ICD-10-CM | POA: Diagnosis not present

## 2015-11-27 DIAGNOSIS — M5116 Intervertebral disc disorders with radiculopathy, lumbar region: Secondary | ICD-10-CM | POA: Diagnosis present

## 2015-11-27 DIAGNOSIS — M532X6 Spinal instabilities, lumbar region: Secondary | ICD-10-CM | POA: Diagnosis not present

## 2015-11-27 DIAGNOSIS — Q909 Down syndrome, unspecified: Secondary | ICD-10-CM | POA: Diagnosis not present

## 2015-11-27 DIAGNOSIS — I69354 Hemiplegia and hemiparesis following cerebral infarction affecting left non-dominant side: Secondary | ICD-10-CM | POA: Diagnosis not present

## 2015-11-27 DIAGNOSIS — M4316 Spondylolisthesis, lumbar region: Secondary | ICD-10-CM | POA: Diagnosis not present

## 2015-11-27 DIAGNOSIS — G9529 Other cord compression: Secondary | ICD-10-CM | POA: Diagnosis not present

## 2015-11-27 DIAGNOSIS — E785 Hyperlipidemia, unspecified: Secondary | ICD-10-CM | POA: Diagnosis present

## 2015-12-12 DIAGNOSIS — E7800 Pure hypercholesterolemia, unspecified: Secondary | ICD-10-CM | POA: Diagnosis not present

## 2015-12-12 DIAGNOSIS — D649 Anemia, unspecified: Secondary | ICD-10-CM | POA: Diagnosis not present

## 2015-12-12 DIAGNOSIS — I6529 Occlusion and stenosis of unspecified carotid artery: Secondary | ICD-10-CM | POA: Diagnosis not present

## 2015-12-12 DIAGNOSIS — Z79899 Other long term (current) drug therapy: Secondary | ICD-10-CM | POA: Diagnosis not present

## 2015-12-12 DIAGNOSIS — R001 Bradycardia, unspecified: Secondary | ICD-10-CM | POA: Diagnosis not present

## 2015-12-12 DIAGNOSIS — I951 Orthostatic hypotension: Secondary | ICD-10-CM | POA: Diagnosis not present

## 2015-12-12 DIAGNOSIS — E039 Hypothyroidism, unspecified: Secondary | ICD-10-CM | POA: Diagnosis not present

## 2015-12-18 DIAGNOSIS — Z1231 Encounter for screening mammogram for malignant neoplasm of breast: Secondary | ICD-10-CM | POA: Diagnosis not present

## 2015-12-31 DIAGNOSIS — Z1231 Encounter for screening mammogram for malignant neoplasm of breast: Secondary | ICD-10-CM | POA: Diagnosis not present

## 2015-12-31 DIAGNOSIS — R922 Inconclusive mammogram: Secondary | ICD-10-CM | POA: Diagnosis not present

## 2015-12-31 DIAGNOSIS — R928 Other abnormal and inconclusive findings on diagnostic imaging of breast: Secondary | ICD-10-CM | POA: Diagnosis not present

## 2016-01-16 DIAGNOSIS — Z4889 Encounter for other specified surgical aftercare: Secondary | ICD-10-CM | POA: Diagnosis not present

## 2016-01-16 DIAGNOSIS — Z981 Arthrodesis status: Secondary | ICD-10-CM | POA: Diagnosis not present

## 2016-01-16 DIAGNOSIS — M4317 Spondylolisthesis, lumbosacral region: Secondary | ICD-10-CM | POA: Diagnosis not present

## 2016-01-16 DIAGNOSIS — M5134 Other intervertebral disc degeneration, thoracic region: Secondary | ICD-10-CM | POA: Diagnosis not present

## 2016-01-20 DIAGNOSIS — H9313 Tinnitus, bilateral: Secondary | ICD-10-CM | POA: Diagnosis not present

## 2016-01-20 DIAGNOSIS — Q909 Down syndrome, unspecified: Secondary | ICD-10-CM | POA: Diagnosis not present

## 2016-01-20 DIAGNOSIS — H903 Sensorineural hearing loss, bilateral: Secondary | ICD-10-CM | POA: Diagnosis not present

## 2016-01-20 DIAGNOSIS — H6983 Other specified disorders of Eustachian tube, bilateral: Secondary | ICD-10-CM | POA: Diagnosis not present

## 2016-01-20 DIAGNOSIS — H7312 Chronic myringitis, left ear: Secondary | ICD-10-CM | POA: Diagnosis not present

## 2016-02-11 DIAGNOSIS — H903 Sensorineural hearing loss, bilateral: Secondary | ICD-10-CM | POA: Diagnosis not present

## 2016-02-11 DIAGNOSIS — H6983 Other specified disorders of Eustachian tube, bilateral: Secondary | ICD-10-CM | POA: Diagnosis not present

## 2016-02-11 DIAGNOSIS — H7312 Chronic myringitis, left ear: Secondary | ICD-10-CM | POA: Diagnosis not present

## 2016-02-11 DIAGNOSIS — H9313 Tinnitus, bilateral: Secondary | ICD-10-CM | POA: Diagnosis not present

## 2016-02-11 DIAGNOSIS — Q909 Down syndrome, unspecified: Secondary | ICD-10-CM | POA: Diagnosis not present

## 2016-02-24 DIAGNOSIS — Z4889 Encounter for other specified surgical aftercare: Secondary | ICD-10-CM | POA: Diagnosis not present

## 2016-02-24 DIAGNOSIS — Z981 Arthrodesis status: Secondary | ICD-10-CM | POA: Diagnosis not present

## 2016-02-24 DIAGNOSIS — M4316 Spondylolisthesis, lumbar region: Secondary | ICD-10-CM | POA: Diagnosis not present

## 2016-02-24 DIAGNOSIS — M4317 Spondylolisthesis, lumbosacral region: Secondary | ICD-10-CM | POA: Diagnosis not present

## 2016-02-24 DIAGNOSIS — M5124 Other intervertebral disc displacement, thoracic region: Secondary | ICD-10-CM | POA: Diagnosis not present

## 2016-03-02 DIAGNOSIS — Q909 Down syndrome, unspecified: Secondary | ICD-10-CM | POA: Diagnosis not present

## 2016-03-02 DIAGNOSIS — R001 Bradycardia, unspecified: Secondary | ICD-10-CM | POA: Diagnosis not present

## 2016-03-02 DIAGNOSIS — T85698A Other mechanical complication of other specified internal prosthetic devices, implants and grafts, initial encounter: Secondary | ICD-10-CM | POA: Diagnosis not present

## 2016-03-02 DIAGNOSIS — H7312 Chronic myringitis, left ear: Secondary | ICD-10-CM | POA: Diagnosis not present

## 2016-03-02 DIAGNOSIS — H6983 Other specified disorders of Eustachian tube, bilateral: Secondary | ICD-10-CM | POA: Diagnosis not present

## 2016-03-02 DIAGNOSIS — H9313 Tinnitus, bilateral: Secondary | ICD-10-CM | POA: Diagnosis not present

## 2016-03-02 DIAGNOSIS — H903 Sensorineural hearing loss, bilateral: Secondary | ICD-10-CM | POA: Diagnosis not present

## 2016-03-19 ENCOUNTER — Ambulatory Visit (INDEPENDENT_AMBULATORY_CARE_PROVIDER_SITE_OTHER): Payer: Medicare Other | Admitting: Cardiology

## 2016-03-19 ENCOUNTER — Encounter: Payer: Self-pay | Admitting: Cardiology

## 2016-03-19 VITALS — BP 98/50

## 2016-03-19 DIAGNOSIS — R0789 Other chest pain: Secondary | ICD-10-CM | POA: Diagnosis not present

## 2016-03-19 DIAGNOSIS — R001 Bradycardia, unspecified: Secondary | ICD-10-CM

## 2016-03-19 DIAGNOSIS — G8929 Other chronic pain: Secondary | ICD-10-CM

## 2016-03-19 DIAGNOSIS — I341 Nonrheumatic mitral (valve) prolapse: Secondary | ICD-10-CM | POA: Diagnosis not present

## 2016-03-19 DIAGNOSIS — I951 Orthostatic hypotension: Secondary | ICD-10-CM | POA: Diagnosis not present

## 2016-03-19 NOTE — Progress Notes (Signed)
Cardiology Office Note    Date:  03/19/2016   ID:  Nicole Wilson, DOB February 23, 1971, MRN [id number]  PCP:  Gennette Pac, MD  Cardiologist:  Sueanne Margarita, MD   No chief complaint on file.   History of Present Illness:  Nicole Wilson is a 45 y.o. female with a history of Down's Syndrome with Moya Moya, bradycardia, Orthostatic hypotension, MVP who presents today for followup. She is doing well. She denies any anginal chest pain, SOB, DOE, LE edema, palpitations or syncope. She occasionally will have some dizziness that she describes as the room spinning. She has chronic chest wall pain which is stable.    Past Medical History  Diagnosis Date  . Hypothyroidism   . Orthostatic hypotension   . Down's syndrome   . Hidradenitis   . Dyslipidemia   . Allergic rhinitis   . Bradycardia   . Hyperlipidemia   . Carotid artery occlusion     bilateral carotid artery bypass for Moya Moya  . MVP (mitral valve prolapse)   . Cervical spondylosis     Past Surgical History  Procedure Laterality Date  . Tonsillectomy and adenoidectomy    . Middle ear surgery    . Carotid artery surgery      Current Medications: Outpatient Prescriptions Prior to Visit  Medication Sig Dispense Refill  . levothyroxine (SYNTHROID, LEVOTHROID) 50 MCG tablet Take 50 mcg by mouth daily.     . Multiple Vitamin (MULTIVITAMIN) tablet Take 1 tablet by mouth daily.    . pravastatin (PRAVACHOL) 40 MG tablet Take 40 mg by mouth daily.    . fludrocortisone (FLORINEF) 0.1 MG tablet Take 1 tablet (0.1 mg total) by mouth daily.     No facility-administered medications prior to visit.     Allergies:   Cephalexin and Dilantin   Social History   Social History  . Marital Status: Single    Spouse Name: N/A  . Number of Children: N/A  . Years of Education: N/A   Social History Main Topics  . Smoking status: Never Smoker   . Smokeless tobacco: Not on file  . Alcohol Use: No  . Drug Use: No  . Sexual  Activity: Not on file   Other Topics Concern  . Not on file   Social History Narrative     Family History:  The patient's family history includes Multiple myeloma in her father; Prostate cancer in her father. There is no history of Heart attack or Stroke.   ROS:   Please see the history of present illness.    ROS All other systems reviewed and are negative.   PHYSICAL EXAM:   VS:  There were no vitals taken for this visit.   GEN: Well nourished, well developed, in no acute distress HEENT: normal Neck: no JVD, carotid bruits, or masses Cardiac: RRR; no murmurs, rubs, or gallops,no edema.  Intact distal pulses bilaterally.  Respiratory:  clear to auscultation bilaterally, normal work of breathing GI: soft, nontender, nondistended, + BS MS: no deformity or atrophy Skin: warm and dry, no rash Neuro:  Alert and Oriented x 3, Strength and sensation are intact Psych: euthymic mood, full affect  Wt Readings from Last 3 Encounters:  07/15/15 137 lb (62.143 kg)  03/28/15 138 lb (62.596 kg)  02/21/15 138 lb 9.6 oz (62.869 kg)      Studies/Labs Reviewed:   EKG:  EKG is  ordered today.  The ekg ordered today demonstrates NSR at 60bpm with IRBBB  Recent  Labs: 03/28/2015: BUN 14; Creatinine, Ser 0.78; Potassium 3.9; Sodium 138   Lipid Panel No results found for: CHOL, TRIG, HDL, CHOLHDL, VLDL, LDLCALC, LDLDIRECT  Additional studies/ records that were reviewed today include:  none    ASSESSMENT:    1. MVP (mitral valve prolapse)   2. Orthostatic hypotension   3. Bradycardia   4. Chronic chest wall pain      PLAN:  In order of problems listed above:  1. MVP - she carries a history of this but echoes recently have only shown trivial MR and no MVP.   2. Orthostatic hypotension - this is stable.  Continue Florinef 3. Bradycardia - heart rate today is 60 4. Chronic chest wall pain - this has resolved.    Medication Adjustments/Labs and Tests Ordered: Current medicines  are reviewed at length with the patient today.  Concerns regarding medicines are outlined above.  Medication changes, Labs and Tests ordered today are listed in the Patient Instructions below.  There are no Patient Instructions on file for this visit.   Lurena Nida, MD  03/19/2016 1:37 PM    [facility] [address] [place], [place], [place]  [address] Phone: [phone]; Fax: [phone]

## 2016-03-19 NOTE — Patient Instructions (Signed)

## 2016-04-16 DIAGNOSIS — H7312 Chronic myringitis, left ear: Secondary | ICD-10-CM | POA: Diagnosis not present

## 2016-04-16 DIAGNOSIS — H903 Sensorineural hearing loss, bilateral: Secondary | ICD-10-CM | POA: Diagnosis not present

## 2016-04-16 DIAGNOSIS — Q909 Down syndrome, unspecified: Secondary | ICD-10-CM | POA: Diagnosis not present

## 2016-04-16 DIAGNOSIS — H9313 Tinnitus, bilateral: Secondary | ICD-10-CM | POA: Diagnosis not present

## 2016-04-16 DIAGNOSIS — H6983 Other specified disorders of Eustachian tube, bilateral: Secondary | ICD-10-CM | POA: Diagnosis not present

## 2016-05-28 DIAGNOSIS — H7312 Chronic myringitis, left ear: Secondary | ICD-10-CM | POA: Diagnosis not present

## 2016-05-28 DIAGNOSIS — H9313 Tinnitus, bilateral: Secondary | ICD-10-CM | POA: Diagnosis not present

## 2016-05-28 DIAGNOSIS — H6983 Other specified disorders of Eustachian tube, bilateral: Secondary | ICD-10-CM | POA: Diagnosis not present

## 2016-05-28 DIAGNOSIS — H903 Sensorineural hearing loss, bilateral: Secondary | ICD-10-CM | POA: Diagnosis not present

## 2016-05-28 DIAGNOSIS — Q909 Down syndrome, unspecified: Secondary | ICD-10-CM | POA: Diagnosis not present

## 2016-06-04 DIAGNOSIS — M4316 Spondylolisthesis, lumbar region: Secondary | ICD-10-CM | POA: Diagnosis not present

## 2016-06-04 DIAGNOSIS — M549 Dorsalgia, unspecified: Secondary | ICD-10-CM | POA: Diagnosis not present

## 2016-06-04 DIAGNOSIS — Z9889 Other specified postprocedural states: Secondary | ICD-10-CM | POA: Diagnosis not present

## 2016-06-04 DIAGNOSIS — M4806 Spinal stenosis, lumbar region: Secondary | ICD-10-CM | POA: Diagnosis not present

## 2016-06-04 DIAGNOSIS — M532X6 Spinal instabilities, lumbar region: Secondary | ICD-10-CM | POA: Diagnosis not present

## 2016-06-04 DIAGNOSIS — Z981 Arthrodesis status: Secondary | ICD-10-CM | POA: Diagnosis not present

## 2016-06-10 DIAGNOSIS — M532X6 Spinal instabilities, lumbar region: Secondary | ICD-10-CM | POA: Diagnosis not present

## 2016-06-10 DIAGNOSIS — R202 Paresthesia of skin: Secondary | ICD-10-CM | POA: Diagnosis not present

## 2016-06-10 DIAGNOSIS — Z981 Arthrodesis status: Secondary | ICD-10-CM | POA: Diagnosis not present

## 2016-06-16 DIAGNOSIS — R202 Paresthesia of skin: Secondary | ICD-10-CM | POA: Diagnosis not present

## 2016-06-16 DIAGNOSIS — M532X6 Spinal instabilities, lumbar region: Secondary | ICD-10-CM | POA: Diagnosis not present

## 2016-06-16 DIAGNOSIS — Z981 Arthrodesis status: Secondary | ICD-10-CM | POA: Diagnosis not present

## 2016-06-18 DIAGNOSIS — Z981 Arthrodesis status: Secondary | ICD-10-CM | POA: Diagnosis not present

## 2016-06-18 DIAGNOSIS — M532X6 Spinal instabilities, lumbar region: Secondary | ICD-10-CM | POA: Diagnosis not present

## 2016-06-18 DIAGNOSIS — R202 Paresthesia of skin: Secondary | ICD-10-CM | POA: Diagnosis not present

## 2016-06-22 DIAGNOSIS — M532X6 Spinal instabilities, lumbar region: Secondary | ICD-10-CM | POA: Diagnosis not present

## 2016-06-22 DIAGNOSIS — Z981 Arthrodesis status: Secondary | ICD-10-CM | POA: Diagnosis not present

## 2016-06-22 DIAGNOSIS — R202 Paresthesia of skin: Secondary | ICD-10-CM | POA: Diagnosis not present

## 2016-06-25 DIAGNOSIS — M532X6 Spinal instabilities, lumbar region: Secondary | ICD-10-CM | POA: Diagnosis not present

## 2016-06-25 DIAGNOSIS — Z981 Arthrodesis status: Secondary | ICD-10-CM | POA: Diagnosis not present

## 2016-06-25 DIAGNOSIS — R202 Paresthesia of skin: Secondary | ICD-10-CM | POA: Diagnosis not present

## 2016-06-30 DIAGNOSIS — M532X6 Spinal instabilities, lumbar region: Secondary | ICD-10-CM | POA: Diagnosis not present

## 2016-06-30 DIAGNOSIS — Z981 Arthrodesis status: Secondary | ICD-10-CM | POA: Diagnosis not present

## 2016-06-30 DIAGNOSIS — R202 Paresthesia of skin: Secondary | ICD-10-CM | POA: Diagnosis not present

## 2016-07-03 DIAGNOSIS — Z981 Arthrodesis status: Secondary | ICD-10-CM | POA: Diagnosis not present

## 2016-07-03 DIAGNOSIS — R202 Paresthesia of skin: Secondary | ICD-10-CM | POA: Diagnosis not present

## 2016-07-03 DIAGNOSIS — M532X6 Spinal instabilities, lumbar region: Secondary | ICD-10-CM | POA: Diagnosis not present

## 2016-07-07 DIAGNOSIS — R202 Paresthesia of skin: Secondary | ICD-10-CM | POA: Diagnosis not present

## 2016-07-07 DIAGNOSIS — Z981 Arthrodesis status: Secondary | ICD-10-CM | POA: Diagnosis not present

## 2016-07-07 DIAGNOSIS — M532X6 Spinal instabilities, lumbar region: Secondary | ICD-10-CM | POA: Diagnosis not present

## 2016-07-09 DIAGNOSIS — M532X6 Spinal instabilities, lumbar region: Secondary | ICD-10-CM | POA: Diagnosis not present

## 2016-07-09 DIAGNOSIS — R202 Paresthesia of skin: Secondary | ICD-10-CM | POA: Diagnosis not present

## 2016-07-09 DIAGNOSIS — Z981 Arthrodesis status: Secondary | ICD-10-CM | POA: Diagnosis not present

## 2016-08-13 DIAGNOSIS — Z23 Encounter for immunization: Secondary | ICD-10-CM | POA: Diagnosis not present

## 2016-08-19 DIAGNOSIS — H66001 Acute suppurative otitis media without spontaneous rupture of ear drum, right ear: Secondary | ICD-10-CM | POA: Diagnosis not present

## 2016-08-19 DIAGNOSIS — Q909 Down syndrome, unspecified: Secondary | ICD-10-CM | POA: Diagnosis not present

## 2016-08-19 DIAGNOSIS — H60331 Swimmer's ear, right ear: Secondary | ICD-10-CM | POA: Diagnosis not present

## 2016-08-19 DIAGNOSIS — H903 Sensorineural hearing loss, bilateral: Secondary | ICD-10-CM | POA: Diagnosis not present

## 2016-08-19 DIAGNOSIS — H7312 Chronic myringitis, left ear: Secondary | ICD-10-CM | POA: Diagnosis not present

## 2016-08-19 DIAGNOSIS — H6983 Other specified disorders of Eustachian tube, bilateral: Secondary | ICD-10-CM | POA: Diagnosis not present

## 2016-08-19 DIAGNOSIS — H9313 Tinnitus, bilateral: Secondary | ICD-10-CM | POA: Diagnosis not present

## 2016-08-24 DIAGNOSIS — Z981 Arthrodesis status: Secondary | ICD-10-CM | POA: Diagnosis not present

## 2016-08-24 DIAGNOSIS — M532X6 Spinal instabilities, lumbar region: Secondary | ICD-10-CM | POA: Diagnosis not present

## 2016-08-24 DIAGNOSIS — R202 Paresthesia of skin: Secondary | ICD-10-CM | POA: Diagnosis not present

## 2016-09-09 DIAGNOSIS — H7312 Chronic myringitis, left ear: Secondary | ICD-10-CM | POA: Diagnosis not present

## 2016-09-09 DIAGNOSIS — Z981 Arthrodesis status: Secondary | ICD-10-CM | POA: Diagnosis not present

## 2016-09-09 DIAGNOSIS — H9313 Tinnitus, bilateral: Secondary | ICD-10-CM | POA: Diagnosis not present

## 2016-09-09 DIAGNOSIS — H6983 Other specified disorders of Eustachian tube, bilateral: Secondary | ICD-10-CM | POA: Diagnosis not present

## 2016-09-09 DIAGNOSIS — H903 Sensorineural hearing loss, bilateral: Secondary | ICD-10-CM | POA: Diagnosis not present

## 2016-09-09 DIAGNOSIS — M532X6 Spinal instabilities, lumbar region: Secondary | ICD-10-CM | POA: Diagnosis not present

## 2016-09-09 DIAGNOSIS — R202 Paresthesia of skin: Secondary | ICD-10-CM | POA: Diagnosis not present

## 2016-09-09 DIAGNOSIS — Q909 Down syndrome, unspecified: Secondary | ICD-10-CM | POA: Diagnosis not present

## 2016-11-24 DIAGNOSIS — Q909 Down syndrome, unspecified: Secondary | ICD-10-CM | POA: Diagnosis not present

## 2016-11-24 DIAGNOSIS — H9313 Tinnitus, bilateral: Secondary | ICD-10-CM | POA: Diagnosis not present

## 2016-11-24 DIAGNOSIS — H66003 Acute suppurative otitis media without spontaneous rupture of ear drum, bilateral: Secondary | ICD-10-CM | POA: Diagnosis not present

## 2016-11-24 DIAGNOSIS — H6983 Other specified disorders of Eustachian tube, bilateral: Secondary | ICD-10-CM | POA: Diagnosis not present

## 2016-11-24 DIAGNOSIS — H60333 Swimmer's ear, bilateral: Secondary | ICD-10-CM | POA: Diagnosis not present

## 2016-11-24 DIAGNOSIS — H60331 Swimmer's ear, right ear: Secondary | ICD-10-CM | POA: Diagnosis not present

## 2016-11-24 DIAGNOSIS — H903 Sensorineural hearing loss, bilateral: Secondary | ICD-10-CM | POA: Diagnosis not present

## 2016-12-10 DIAGNOSIS — Z9889 Other specified postprocedural states: Secondary | ICD-10-CM | POA: Diagnosis not present

## 2016-12-10 DIAGNOSIS — R52 Pain, unspecified: Secondary | ICD-10-CM | POA: Diagnosis not present

## 2016-12-10 DIAGNOSIS — Z981 Arthrodesis status: Secondary | ICD-10-CM | POA: Diagnosis not present

## 2016-12-10 DIAGNOSIS — M47815 Spondylosis without myelopathy or radiculopathy, thoracolumbar region: Secondary | ICD-10-CM | POA: Diagnosis not present

## 2016-12-10 DIAGNOSIS — M4802 Spinal stenosis, cervical region: Secondary | ICD-10-CM | POA: Diagnosis not present

## 2016-12-10 DIAGNOSIS — M4805 Spinal stenosis, thoracolumbar region: Secondary | ICD-10-CM | POA: Diagnosis not present

## 2016-12-10 DIAGNOSIS — M4316 Spondylolisthesis, lumbar region: Secondary | ICD-10-CM | POA: Diagnosis not present

## 2016-12-10 DIAGNOSIS — I69398 Other sequelae of cerebral infarction: Secondary | ICD-10-CM | POA: Diagnosis not present

## 2016-12-10 DIAGNOSIS — M532X6 Spinal instabilities, lumbar region: Secondary | ICD-10-CM | POA: Diagnosis not present

## 2016-12-10 DIAGNOSIS — M4315 Spondylolisthesis, thoracolumbar region: Secondary | ICD-10-CM | POA: Diagnosis not present

## 2016-12-10 DIAGNOSIS — Z8679 Personal history of other diseases of the circulatory system: Secondary | ICD-10-CM | POA: Diagnosis not present

## 2017-01-01 ENCOUNTER — Encounter (HOSPITAL_COMMUNITY): Payer: Self-pay | Admitting: Emergency Medicine

## 2017-01-01 ENCOUNTER — Emergency Department (HOSPITAL_COMMUNITY)
Admission: EM | Admit: 2017-01-01 | Discharge: 2017-01-01 | Disposition: A | Discharge status: Home / Self Care | Payer: Medicare Other | Attending: Emergency Medicine | Admitting: Emergency Medicine

## 2017-01-01 DIAGNOSIS — I951 Orthostatic hypotension: Secondary | ICD-10-CM | POA: Diagnosis not present

## 2017-01-01 DIAGNOSIS — G4489 Other headache syndrome: Secondary | ICD-10-CM | POA: Diagnosis not present

## 2017-01-01 DIAGNOSIS — I959 Hypotension, unspecified: Secondary | ICD-10-CM | POA: Diagnosis present

## 2017-01-01 DIAGNOSIS — R031 Nonspecific low blood-pressure reading: Secondary | ICD-10-CM | POA: Diagnosis not present

## 2017-01-01 DIAGNOSIS — E039 Hypothyroidism, unspecified: Secondary | ICD-10-CM | POA: Insufficient documentation

## 2017-01-01 LAB — BASIC METABOLIC PANEL
ANION GAP: 5 (ref 5–15)
BUN: 17 mg/dL (ref 6–20)
CO2: 24 mmol/L (ref 22–32)
Calcium: 8.4 mg/dL — ABNORMAL LOW (ref 8.9–10.3)
Chloride: 110 mmol/L (ref 101–111)
Creatinine, Ser: 1.19 mg/dL — ABNORMAL HIGH (ref 0.44–1.00)
GFR, EST NON AFRICAN AMERICAN: 54 mL/min — AB (ref >60)
Glucose, Bld: 103 mg/dL — ABNORMAL HIGH (ref 65–99)
POTASSIUM: 3.6 mmol/L (ref 3.5–5.1)
SODIUM: 139 mmol/L (ref 135–145)

## 2017-01-01 LAB — CBC WITH DIFFERENTIAL/PLATELET
BASOS ABS: 0 10*3/uL (ref 0.0–0.1)
Basophils Relative: 0 %
EOS PCT: 1 %
Eosinophils Absolute: 0 10*3/uL (ref 0.0–0.7)
HCT: 39.7 % (ref 36.0–46.0)
Hemoglobin: 13.4 g/dL (ref 12.0–15.0)
LYMPHS ABS: 0.6 10*3/uL — AB (ref 0.7–4.0)
LYMPHS PCT: 15 %
MCH: 31.7 pg (ref 26.0–34.0)
MCHC: 33.8 g/dL (ref 30.0–36.0)
MCV: 93.9 fL (ref 78.0–100.0)
Monocytes Absolute: 0.6 10*3/uL (ref 0.1–1.0)
Monocytes Relative: 14 %
NEUTROS PCT: 70 %
Neutro Abs: 2.8 10*3/uL (ref 1.7–7.7)
PLATELETS: 166 10*3/uL (ref 150–400)
RBC: 4.23 MIL/uL (ref 3.87–5.11)
RDW: 15 % (ref 11.5–15.5)
WBC: 4 10*3/uL (ref 4.0–10.5)

## 2017-01-01 LAB — I-STAT TROPONIN, ED: TROPONIN I, POC: 0 ng/mL (ref 0.00–0.08)

## 2017-01-01 MED ORDER — SODIUM CHLORIDE 0.9 % IV BOLUS (SEPSIS)
500.0000 mL | Freq: Once | INTRAVENOUS | Status: AC
  Administered 2017-01-01: 500 mL via INTRAVENOUS

## 2017-01-01 NOTE — ED Triage Notes (Signed)
Patient was out shopping, history of Down's snydrome.  Patient's blood pressure dropped. Patient did not hit head or LOC.Per EMS:   Lying flat [id number] systolic Sitting [id number] P: 72 CBG: 113  Last vitals after fluids:  BP: 116/54 P:64 R:16 94% on room air    500 ml of fluids administered.

## 2017-01-01 NOTE — ED Notes (Signed)
Bed: WA20 Expected date: 01/01/17 Expected time: 2:29 PM Means of arrival: Ambulance Comments: Syncopal

## 2017-01-01 NOTE — ED Provider Notes (Signed)
[facility] DEPT Provider Note   CSN: [id number] Arrival date & time: 01/01/17  1430     History   Chief Complaint Chief Complaint  Patient presents with  . Hypotension    HPI Nicole Wilson is a 46 y.o. female.  HPI Patient was with her parents shopping at [facility]. She became lightheaded and they helped ease her to the floor. Therefore she did pass out briefly. The patient rested for a period of time and tried to sit back up again but became lightheaded again. He had her lie back down for the second time. EMS came and noted that her blood pressure was normal in the supine position but upon sitting it dropped. She was given 500 mL of normal saline on route. Patient denies she experience any chest pain or shortness of breath. She did experience lightheadedness earlier in the day. Her parents report she has been eating well and no recent vomiting or diarrhea. Patient was started on Lyrica and about the past 3 weeks for lower back pain. She does take Florinef for hypotension. Past Medical History:  Diagnosis Date  . Allergic rhinitis   . Bradycardia   . Carotid artery occlusion    bilateral carotid artery bypass for Moya Moya  . Cervical spondylosis   . Down's syndrome   . Dyslipidemia   . Hidradenitis   . Hyperlipidemia   . Hypothyroidism   . MVP (mitral valve prolapse)   . Orthostatic hypotension     Patient Active Problem List   Diagnosis Date Noted  . Chronic chest wall pain 10/06/2013  . Bradycardia   . Carotid artery occlusion   . MVP (mitral valve prolapse)   . Hypothyroidism   . Orthostatic hypotension   . Down's syndrome   . Hidradenitis   . Dyslipidemia   . Allergic rhinitis     Past Surgical History:  Procedure Laterality Date  . carotid artery surgery    . MIDDLE EAR SURGERY    . TONSILLECTOMY AND ADENOIDECTOMY      OB History    No data available       Home Medications    Prior to Admission medications   Medication Sig Start Date End Date  Taking? Authorizing Provider  levothyroxine (SYNTHROID, LEVOTHROID) 100 MCG tablet Take 50 mcg by mouth daily.  07/19/13  Yes Historical Provider, MD  LYRICA 50 MG capsule Take 50 mg by mouth 2 (two) times daily. 12/10/16  Yes Historical Provider, MD  Multiple Vitamin (MULTIVITAMIN) tablet Take 1 tablet by mouth daily.   Yes Historical Provider, MD  pravastatin (PRAVACHOL) 40 MG tablet Take 40 mg by mouth daily. 01/14/15  Yes Historical Provider, MD  fludrocortisone (FLORINEF) 0.1 MG tablet Take 1 tablet (0.1 mg total) by mouth daily. Patient not taking: Reported on 01/01/2017 07/15/15   Liliane Shi, PA-C    Family History Family History  Problem Relation Age of Onset  . Multiple myeloma Father   . Prostate cancer Father   . Heart attack Neg Hx   . Stroke Neg Hx     Social History Social History  Substance Use Topics  . Smoking status: Never Smoker  . Smokeless tobacco: Never Used  . Alcohol use No     Allergies   Cephalexin and Dilantin [phenytoin]   Review of Systems Review of Systems 10 Systems reviewed and are negative for acute change except as noted in the HPI.   Physical Exam Updated Vital Signs BP 100/72 (BP Location: Left Arm)  Pulse 64   Temp 98 F (36.7 C) (Oral)   Resp 18   Ht 5' (1.524 m)   Wt 150 lb (68 kg)   LMP 12/30/2016   SpO2 96%   BMI 29.29 kg/m   Physical Exam  Constitutional: She appears well-developed and well-nourished. No distress.  Patient has habitus consistent with Down syndrome. She is alert and nontoxic. Well in appearance.  HENT:  Head: Normocephalic and atraumatic.  Mouth/Throat: Oropharynx is clear and moist.  Eyes: Conjunctivae are normal. Pupils are equal, round, and reactive to light.  Patient is a baseline disconjugate gaze.  Neck: Neck supple.  Cardiovascular: Normal rate and regular rhythm.   No murmur heard. Pulmonary/Chest: Effort normal and breath sounds normal. No respiratory distress.  Abdominal: Soft. There is no  tenderness.  Musculoskeletal: She exhibits no edema, tenderness or deformity.  Neurological: She is alert. She exhibits normal muscle tone. Coordination normal.  Skin: Skin is warm and dry.  Psychiatric: She has a normal mood and affect.  Nursing note and vitals reviewed.    ED Treatments / Results  Labs (all labs ordered are listed, but only abnormal results are displayed) Labs Reviewed  BASIC METABOLIC PANEL - Abnormal; Notable for the following:       Result Value   Glucose, Bld 103 (*)    Creatinine, Ser 1.19 (*)    Calcium 8.4 (*)    GFR calc non Af Amer 54 (*)    All other components within normal limits  CBC WITH DIFFERENTIAL/PLATELET - Abnormal; Notable for the following:    Lymphs Abs 0.6 (*)    All other components within normal limits  I-STAT TROPOININ, ED    EKG  EKG Interpretation  Date/Time:  Friday January 01 2017 15:28:25 EST Ventricular Rate:  56 PR Interval:    QRS Duration: 97 QT Interval:  413 QTC Calculation: 399 R Axis:   74 Text Interpretation:  Sinus rhythm no change from old  Confirmed by Johnney Killian, Major [phone]) on 01/01/2017 5:50:24 PM       Radiology No results found.  Procedures Procedures (including critical care time)  Medications Ordered in ED Medications  sodium chloride 0.9 % bolus 500 mL (0 mLs Intravenous Stopped 01/01/17 1735)     Initial Impression / Assessment and Plan / ED Course  I have reviewed the triage vital signs and the nursing notes.  Pertinent labs & imaging results that were available during my care of the patient were reviewed by me and considered in my medical decision making (see chart for details).     Patient has been up and ambulatory to the bathroom. She is not expressing any further lightheadedness upon position change.  Final Clinical Impressions(s) / ED Diagnoses   Final diagnoses:  Orthostatic hypotension  Patient prior history of hypotension. Patient has not recently been ill. She does  not express chest pain. Diagnostic workup suggests mild dehydration with slight elevation in BUN. It has been rehydrated and responded well. She does have follow up with her PCP this upcoming week. Patient and family counseled  returning if symptoms should return or concerning symptoms develop.  New Prescriptions New Prescriptions   No medications on file     Charlesetta Shanks, MD 01/01/17 1756

## 2017-01-06 DIAGNOSIS — Z79899 Other long term (current) drug therapy: Secondary | ICD-10-CM | POA: Diagnosis not present

## 2017-01-06 DIAGNOSIS — Z8679 Personal history of other diseases of the circulatory system: Secondary | ICD-10-CM | POA: Diagnosis not present

## 2017-01-06 DIAGNOSIS — E039 Hypothyroidism, unspecified: Secondary | ICD-10-CM | POA: Diagnosis not present

## 2017-01-06 DIAGNOSIS — E782 Mixed hyperlipidemia: Secondary | ICD-10-CM | POA: Diagnosis not present

## 2017-01-06 DIAGNOSIS — I6529 Occlusion and stenosis of unspecified carotid artery: Secondary | ICD-10-CM | POA: Diagnosis not present

## 2017-03-03 DIAGNOSIS — H6983 Other specified disorders of Eustachian tube, bilateral: Secondary | ICD-10-CM | POA: Diagnosis not present

## 2017-03-03 DIAGNOSIS — H9313 Tinnitus, bilateral: Secondary | ICD-10-CM | POA: Diagnosis not present

## 2017-03-03 DIAGNOSIS — H903 Sensorineural hearing loss, bilateral: Secondary | ICD-10-CM | POA: Diagnosis not present

## 2017-03-03 DIAGNOSIS — H60331 Swimmer's ear, right ear: Secondary | ICD-10-CM | POA: Diagnosis not present

## 2017-03-03 DIAGNOSIS — H60333 Swimmer's ear, bilateral: Secondary | ICD-10-CM | POA: Diagnosis not present

## 2017-03-03 DIAGNOSIS — H66001 Acute suppurative otitis media without spontaneous rupture of ear drum, right ear: Secondary | ICD-10-CM | POA: Diagnosis not present

## 2017-03-03 DIAGNOSIS — Q909 Down syndrome, unspecified: Secondary | ICD-10-CM | POA: Diagnosis not present

## 2017-05-04 DIAGNOSIS — H66001 Acute suppurative otitis media without spontaneous rupture of ear drum, right ear: Secondary | ICD-10-CM | POA: Diagnosis not present

## 2017-05-04 DIAGNOSIS — H903 Sensorineural hearing loss, bilateral: Secondary | ICD-10-CM | POA: Diagnosis not present

## 2017-05-04 DIAGNOSIS — Q909 Down syndrome, unspecified: Secondary | ICD-10-CM | POA: Diagnosis not present

## 2017-05-04 DIAGNOSIS — H6613 Chronic tubotympanic suppurative otitis media, bilateral: Secondary | ICD-10-CM | POA: Diagnosis not present

## 2017-05-04 DIAGNOSIS — H9313 Tinnitus, bilateral: Secondary | ICD-10-CM | POA: Diagnosis not present

## 2017-05-04 DIAGNOSIS — H6983 Other specified disorders of Eustachian tube, bilateral: Secondary | ICD-10-CM | POA: Diagnosis not present

## 2017-05-04 DIAGNOSIS — H60393 Other infective otitis externa, bilateral: Secondary | ICD-10-CM | POA: Diagnosis not present

## 2017-05-18 ENCOUNTER — Encounter: Payer: Self-pay | Admitting: *Deleted

## 2017-05-27 DIAGNOSIS — Q909 Down syndrome, unspecified: Secondary | ICD-10-CM | POA: Diagnosis not present

## 2017-05-27 DIAGNOSIS — H6983 Other specified disorders of Eustachian tube, bilateral: Secondary | ICD-10-CM | POA: Diagnosis not present

## 2017-05-27 DIAGNOSIS — H903 Sensorineural hearing loss, bilateral: Secondary | ICD-10-CM | POA: Diagnosis not present

## 2017-05-27 DIAGNOSIS — H9313 Tinnitus, bilateral: Secondary | ICD-10-CM | POA: Diagnosis not present

## 2017-06-01 ENCOUNTER — Encounter: Payer: Self-pay | Admitting: Cardiology

## 2017-06-01 ENCOUNTER — Ambulatory Visit (INDEPENDENT_AMBULATORY_CARE_PROVIDER_SITE_OTHER): Payer: Medicare Other | Admitting: Cardiology

## 2017-06-01 VITALS — BP 92/62 | HR 80 | Ht <= 58 in | Wt 145.2 lb

## 2017-06-01 DIAGNOSIS — G8929 Other chronic pain: Secondary | ICD-10-CM | POA: Diagnosis not present

## 2017-06-01 DIAGNOSIS — I341 Nonrheumatic mitral (valve) prolapse: Secondary | ICD-10-CM | POA: Diagnosis not present

## 2017-06-01 DIAGNOSIS — I951 Orthostatic hypotension: Secondary | ICD-10-CM | POA: Diagnosis not present

## 2017-06-01 DIAGNOSIS — R0789 Other chest pain: Secondary | ICD-10-CM | POA: Diagnosis not present

## 2017-06-01 DIAGNOSIS — R001 Bradycardia, unspecified: Secondary | ICD-10-CM | POA: Diagnosis not present

## 2017-06-01 DIAGNOSIS — E669 Obesity, unspecified: Secondary | ICD-10-CM

## 2017-06-01 HISTORY — DX: Obesity, unspecified: E66.9

## 2017-06-01 NOTE — Patient Instructions (Signed)
Medication Instructions:  Your physician recommends that you continue on your current medications as directed. Please refer to the Current Medication list given to you today.   Labwork: None  Testing/Procedures: None  Follow-Up: You have been referred to NUTRITION.  Your physician wants you to follow-up in: 1 year with Dr. Radford Pax. You will receive a reminder letter in the mail two months in advance. If you don't receive a letter, please call our office to schedule the follow-up appointment.   Any Other Special Instructions Will Be Listed Below (If Applicable).     If you need a refill on your cardiac medications before your next appointment, please call your pharmacy.

## 2017-06-01 NOTE — Progress Notes (Signed)
Cardiology Office Note    Date:  06/01/2017   ID:  Nicole Wilson, DOB May 08, 1971, MRN [id number]  PCP:  [name], MD  Cardiologist:  Fransico Him, MD   Chief Complaint  Patient presents with  . Follow-up    chest wall pain, MVP, bradycardia, orhtostatc hyptension    History of Present Illness:  Nicole Wilson is a 46 y.o. female with a history of Down's Syndrome with Moya Moya, bradycardia, Orthostatic hypotension and MVP .  She is here today  for followup and  is doing well. She denies any anginal chest pain or pressure, SOB, DOE, PND, orthopnea, LE edema, palpitations or syncope. She has chronic chest wall pain that is similar to what she has had in the past and remains sharp and tingling.  Deep breathing helps.    She also has been having numbness and tingling in there left arm, left leg and foot.  She has a history of significant DJD of her cervical spine.  She still intermittently gets lightheaded when getting up too fast.     Past Medical History:  Diagnosis Date  . Allergic rhinitis   . Bradycardia   . Carotid artery occlusion    bilateral carotid artery bypass for Moya Moya  . Cervical spondylosis   . Down's syndrome   . Dyslipidemia   . Hidradenitis   . Hyperlipidemia   . Hypothyroidism   . MVP (mitral valve prolapse)    resolved on echo 2016  . Obesity (BMI 30-39.9) 06/01/2017  . Orthostatic hypotension     Past Surgical History:  Procedure Laterality Date  . carotid artery surgery    . MIDDLE EAR SURGERY    . TONSILLECTOMY AND ADENOIDECTOMY      Current Medications: Current Meds  Medication Sig  . ibuprofen (ADVIL,MOTRIN) 200 MG tablet Take 200 mg by mouth as needed.  Marland Kitchen levothyroxine (SYNTHROID, LEVOTHROID) 100 MCG tablet Take 50 mcg by mouth daily.   Marland Kitchen LYRICA 50 MG capsule Take 50 mg by mouth 2 (two) times daily.  . Multiple Vitamin (MULTIVITAMIN) tablet Take 1 tablet by mouth daily.  . pravastatin (PRAVACHOL) 40 MG tablet Take 40 mg by mouth  daily.    Allergies:   Cephalexin and Dilantin [phenytoin]   Social History   Social History  . Marital status: Single    Spouse name: N/A  . Number of children: N/A  . Years of education: N/A   Social History Main Topics  . Smoking status: Never Smoker  . Smokeless tobacco: Never Used  . Alcohol use No  . Drug use: No  . Sexual activity: Not Asked   Other Topics Concern  . None   Social History Narrative  . None     Family History:  The patient's family history includes Multiple myeloma in her father; Prostate cancer in her father.   ROS:   Please see the history of present illness.    ROS All other systems reviewed and are negative.  No flowsheet data found.     PHYSICAL EXAM:   VS:  BP 92/62   Pulse 80   Ht 4' 10"  (1.473 m)   Wt 145 lb 3.2 oz (65.9 kg)   BMI 30.35 kg/m    GEN: Well nourished, well developed, in no acute distress  HEENT: normal  Neck: no JVD, carotid bruits, or masses Cardiac: RRR; no murmurs, rubs, or gallops,no edema.  Intact distal pulses bilaterally.  Respiratory:  clear to auscultation bilaterally, normal  work of breathing GI: soft, nontender, nondistended, + BS MS: no deformity or atrophy  Skin: warm and dry, no rash Neuro:  Alert and Oriented x 3, Strength and sensation are intact Psych: euthymic mood, full affect  Wt Readings from Last 3 Encounters:  06/01/17 145 lb 3.2 oz (65.9 kg)  01/01/17 150 lb (68 kg)  07/15/15 137 lb (62.1 kg)      Studies/Labs Reviewed:   EKG:  EKG is not ordered today.    Recent Labs: 01/01/2017: BUN 17; Creatinine, Ser 1.19; Hemoglobin 13.4; Platelets 166; Potassium 3.6; Sodium 139   Lipid Panel No results found for: CHOL, TRIG, HDL, CHOLHDL, VLDL, LDLCALC, LDLDIRECT  Additional studies/ records that were reviewed today include:  none    ASSESSMENT:    1. Chronic chest wall pain   2. MVP (mitral valve prolapse)   3. Orthostatic hypotension   4. Bradycardia   5. Obesity (BMI  30-39.9)      PLAN:  In order of problems listed above:  1. Chronic chest wall pain - this is the same as in prior years.  It occurs where her bra rests and is better when she takes her bra off.  It is not exertional. I do not think that this is cardiac related. She has significant DJD of her cervical spine by MRI 2014 and is now having left arm numbness and tingling as well as her left leg and foot. I suspect she has had worsening of her DJD.  She has an appt at [facility].  2. MVP - no evidence on echo 2016  3. Orthostatic hypotension - stable and only occurs on occasion with standing up quickly  4. Bradycardia - this has resolved.  5. Obesity - I have encouraged her to get into a routine exercise program.  I recommended that she and her family consider a nutrition consult.      Medication Adjustments/Labs and Tests Ordered: Current medicines are reviewed at length with the patient today.  Concerns regarding medicines are outlined above.  Medication changes, Labs and Tests ordered today are listed in the Patient Instructions below.  There are no Patient Instructions on file for this visit.   Signed, Fransico Him, MD  06/01/2017 1:36 PM    [facility] [address], [place], [place]  [address] Phone: [phone]; Fax: [phone]

## 2017-06-03 DIAGNOSIS — Z981 Arthrodesis status: Secondary | ICD-10-CM | POA: Diagnosis not present

## 2017-06-03 DIAGNOSIS — Z9889 Other specified postprocedural states: Secondary | ICD-10-CM | POA: Diagnosis not present

## 2017-06-03 DIAGNOSIS — M5030 Other cervical disc degeneration, unspecified cervical region: Secondary | ICD-10-CM | POA: Diagnosis not present

## 2017-06-03 DIAGNOSIS — M4315 Spondylolisthesis, thoracolumbar region: Secondary | ICD-10-CM | POA: Diagnosis not present

## 2017-06-03 DIAGNOSIS — M4186 Other forms of scoliosis, lumbar region: Secondary | ICD-10-CM | POA: Diagnosis not present

## 2017-06-03 DIAGNOSIS — I675 Moyamoya disease: Secondary | ICD-10-CM | POA: Diagnosis not present

## 2017-06-03 DIAGNOSIS — M5136 Other intervertebral disc degeneration, lumbar region: Secondary | ICD-10-CM | POA: Diagnosis not present

## 2017-06-03 DIAGNOSIS — M519 Unspecified thoracic, thoracolumbar and lumbosacral intervertebral disc disorder: Secondary | ICD-10-CM | POA: Diagnosis not present

## 2017-06-03 DIAGNOSIS — Z8679 Personal history of other diseases of the circulatory system: Secondary | ICD-10-CM | POA: Diagnosis not present

## 2017-06-03 DIAGNOSIS — Q909 Down syndrome, unspecified: Secondary | ICD-10-CM | POA: Diagnosis not present

## 2017-06-03 DIAGNOSIS — M5412 Radiculopathy, cervical region: Secondary | ICD-10-CM | POA: Diagnosis not present

## 2017-06-03 DIAGNOSIS — M5116 Intervertebral disc disorders with radiculopathy, lumbar region: Secondary | ICD-10-CM | POA: Diagnosis not present

## 2017-06-16 DIAGNOSIS — M545 Low back pain: Secondary | ICD-10-CM | POA: Diagnosis not present

## 2017-06-16 DIAGNOSIS — M5136 Other intervertebral disc degeneration, lumbar region: Secondary | ICD-10-CM | POA: Diagnosis not present

## 2017-06-16 DIAGNOSIS — M9973 Connective tissue and disc stenosis of intervertebral foramina of lumbar region: Secondary | ICD-10-CM | POA: Diagnosis not present

## 2017-06-17 ENCOUNTER — Encounter: Payer: Medicare Other | Attending: Cardiology | Admitting: Skilled Nursing Facility1

## 2017-06-17 ENCOUNTER — Encounter: Payer: Self-pay | Admitting: Skilled Nursing Facility1

## 2017-06-17 DIAGNOSIS — E669 Obesity, unspecified: Secondary | ICD-10-CM | POA: Insufficient documentation

## 2017-06-17 DIAGNOSIS — Z713 Dietary counseling and surveillance: Secondary | ICD-10-CM | POA: Insufficient documentation

## 2017-06-17 DIAGNOSIS — Z6830 Body mass index (BMI) 30.0-30.9, adult: Secondary | ICD-10-CM | POA: Diagnosis not present

## 2017-06-17 NOTE — Progress Notes (Signed)
  Medical Nutrition Therapy:  Appt start time: 11:00 end time:  11:50   Assessment:  Primary concerns today: obesity. Pt arrives with her mom, dad and nephew. Pt states she is up 10 pounds a year ago. Pt states her favorite food is  fried okra. Pt is very concerned about her weight. Dietitian advised she start eating breakfast but the pt really did not want to because it would make her fat. Pts mom told her she would eat breakfast with her and pt repeated she is not a breakfast person. Pts father states both of her sisters are slim and fit and thinks that is where [place] got her idea of being fat. Pts father states she does not like change and they have to have meals at the exact same time every day. Pt states she does not like yogurt with greek in it. Pt states she does not want to eat breakfast because it will make her fat. Pts mother states she tries to be active but pts father corrected her stating she is not active. Pt states sometimes her back bothers her and states there is a bump that needs to be removed. PTs parents state she herself thinks she needs surgery but no doctor has told her that.   Pt does NOT want to be weighed.    MEDICATIONS: See List   DIETARY INTAKE:  Usual eating pattern includes 2 meals and 2 snacks per day.  Everyday foods include chips.  Avoided foods include asparagus and brussel sprouts.    24-hr recall:  B ( AM): skipping  Snk ( AM):  L ( PM): roast beef sandwich with cheese with chips or patato salad Snk ( PM): roasted honey nuts and chips  D ( PM): salad: lettuce, cheese, brocccoli, crispy onions, croutons, carrots, chicken and mashed potatoes shrimp Snk ( PM): ice cream  Beverages: diet soda, fruit 2-0   Usual physical activity: checks the mail   Estimated energy needs: 1800 calories 200 g carbohydrates 135 g protein 50 g fat  Progress Towards Goal(s):  In progress.    Intervention:  Nutrition counseling for obesity. Dietitian educated the pt and  her family on healthy eating.  Goals: -Have something small when you first wake up: grapes and greek yogurt -Be active for 15-20 minutes a day: try the treadmill while playing on the ipad   Teaching Method Utilized:  Visual Auditory Hands on  Handouts given during visit include:  MyPlate  Barriers to learning/adherence to lifestyle change: Down syndrome  Demonstrated degree of understanding via:  Teach Back   Monitoring/Evaluation:  Dietary intake, exercise, and body weight prn.

## 2017-08-03 DIAGNOSIS — M48061 Spinal stenosis, lumbar region without neurogenic claudication: Secondary | ICD-10-CM | POA: Diagnosis not present

## 2017-08-05 DIAGNOSIS — S83002A Unspecified subluxation of left patella, initial encounter: Secondary | ICD-10-CM | POA: Diagnosis not present

## 2017-08-05 DIAGNOSIS — M5136 Other intervertebral disc degeneration, lumbar region: Secondary | ICD-10-CM | POA: Diagnosis not present

## 2017-08-19 DIAGNOSIS — M5416 Radiculopathy, lumbar region: Secondary | ICD-10-CM | POA: Diagnosis not present

## 2017-08-23 DIAGNOSIS — Z23 Encounter for immunization: Secondary | ICD-10-CM | POA: Diagnosis not present

## 2017-08-24 DIAGNOSIS — R200 Anesthesia of skin: Secondary | ICD-10-CM | POA: Diagnosis not present

## 2017-08-24 DIAGNOSIS — M4317 Spondylolisthesis, lumbosacral region: Secondary | ICD-10-CM | POA: Diagnosis not present

## 2017-08-24 DIAGNOSIS — M5136 Other intervertebral disc degeneration, lumbar region: Secondary | ICD-10-CM | POA: Diagnosis not present

## 2017-08-24 DIAGNOSIS — R202 Paresthesia of skin: Secondary | ICD-10-CM | POA: Diagnosis not present

## 2017-09-09 DIAGNOSIS — M5136 Other intervertebral disc degeneration, lumbar region: Secondary | ICD-10-CM | POA: Diagnosis not present

## 2017-09-09 DIAGNOSIS — M4317 Spondylolisthesis, lumbosacral region: Secondary | ICD-10-CM | POA: Diagnosis not present

## 2017-09-09 DIAGNOSIS — R202 Paresthesia of skin: Secondary | ICD-10-CM | POA: Diagnosis not present

## 2017-09-16 DIAGNOSIS — M5136 Other intervertebral disc degeneration, lumbar region: Secondary | ICD-10-CM | POA: Diagnosis not present

## 2017-09-16 DIAGNOSIS — M5442 Lumbago with sciatica, left side: Secondary | ICD-10-CM | POA: Diagnosis not present

## 2017-09-16 DIAGNOSIS — R202 Paresthesia of skin: Secondary | ICD-10-CM | POA: Diagnosis not present

## 2017-09-16 DIAGNOSIS — M4317 Spondylolisthesis, lumbosacral region: Secondary | ICD-10-CM | POA: Diagnosis not present

## 2017-09-16 DIAGNOSIS — G8929 Other chronic pain: Secondary | ICD-10-CM | POA: Diagnosis not present

## 2017-09-23 DIAGNOSIS — G8929 Other chronic pain: Secondary | ICD-10-CM | POA: Diagnosis not present

## 2017-09-23 DIAGNOSIS — R202 Paresthesia of skin: Secondary | ICD-10-CM | POA: Diagnosis not present

## 2017-09-23 DIAGNOSIS — M4317 Spondylolisthesis, lumbosacral region: Secondary | ICD-10-CM | POA: Diagnosis not present

## 2017-09-23 DIAGNOSIS — M5442 Lumbago with sciatica, left side: Secondary | ICD-10-CM | POA: Diagnosis not present

## 2017-09-23 DIAGNOSIS — M5136 Other intervertebral disc degeneration, lumbar region: Secondary | ICD-10-CM | POA: Diagnosis not present

## 2017-10-04 DIAGNOSIS — H66003 Acute suppurative otitis media without spontaneous rupture of ear drum, bilateral: Secondary | ICD-10-CM | POA: Diagnosis not present

## 2017-10-04 DIAGNOSIS — H60333 Swimmer's ear, bilateral: Secondary | ICD-10-CM | POA: Diagnosis not present

## 2017-10-04 DIAGNOSIS — H9313 Tinnitus, bilateral: Secondary | ICD-10-CM | POA: Diagnosis not present

## 2017-10-04 DIAGNOSIS — H6613 Chronic tubotympanic suppurative otitis media, bilateral: Secondary | ICD-10-CM | POA: Diagnosis not present

## 2017-10-04 DIAGNOSIS — H6983 Other specified disorders of Eustachian tube, bilateral: Secondary | ICD-10-CM | POA: Diagnosis not present

## 2017-10-04 DIAGNOSIS — Q909 Down syndrome, unspecified: Secondary | ICD-10-CM | POA: Diagnosis not present

## 2017-10-04 DIAGNOSIS — H66001 Acute suppurative otitis media without spontaneous rupture of ear drum, right ear: Secondary | ICD-10-CM | POA: Diagnosis not present

## 2017-10-04 DIAGNOSIS — H7312 Chronic myringitis, left ear: Secondary | ICD-10-CM | POA: Diagnosis not present

## 2017-10-04 DIAGNOSIS — H903 Sensorineural hearing loss, bilateral: Secondary | ICD-10-CM | POA: Diagnosis not present

## 2017-10-04 DIAGNOSIS — H60393 Other infective otitis externa, bilateral: Secondary | ICD-10-CM | POA: Diagnosis not present

## 2017-10-16 DIAGNOSIS — I2699 Other pulmonary embolism without acute cor pulmonale: Secondary | ICD-10-CM

## 2017-10-16 HISTORY — DX: Other pulmonary embolism without acute cor pulmonale: I26.99

## 2017-10-18 ENCOUNTER — Emergency Department (HOSPITAL_COMMUNITY)
Admission: EM | Admit: 2017-10-18 | Discharge: 2017-10-18 | Disposition: A | Discharge status: Home / Self Care | Payer: Medicare Other | Source: Home / Self Care | Attending: Emergency Medicine | Admitting: Emergency Medicine

## 2017-10-18 ENCOUNTER — Encounter (HOSPITAL_COMMUNITY): Payer: Self-pay | Admitting: Emergency Medicine

## 2017-10-18 DIAGNOSIS — E039 Hypothyroidism, unspecified: Secondary | ICD-10-CM

## 2017-10-18 DIAGNOSIS — I82422 Acute embolism and thrombosis of left iliac vein: Secondary | ICD-10-CM | POA: Diagnosis not present

## 2017-10-18 DIAGNOSIS — I2699 Other pulmonary embolism without acute cor pulmonale: Secondary | ICD-10-CM | POA: Diagnosis not present

## 2017-10-18 DIAGNOSIS — Q909 Down syndrome, unspecified: Secondary | ICD-10-CM | POA: Diagnosis not present

## 2017-10-18 DIAGNOSIS — I82412 Acute embolism and thrombosis of left femoral vein: Secondary | ICD-10-CM | POA: Diagnosis not present

## 2017-10-18 DIAGNOSIS — R55 Syncope and collapse: Secondary | ICD-10-CM | POA: Diagnosis not present

## 2017-10-18 DIAGNOSIS — I82409 Acute embolism and thrombosis of unspecified deep veins of unspecified lower extremity: Secondary | ICD-10-CM | POA: Diagnosis not present

## 2017-10-18 DIAGNOSIS — I2609 Other pulmonary embolism with acute cor pulmonale: Secondary | ICD-10-CM | POA: Diagnosis not present

## 2017-10-18 DIAGNOSIS — R110 Nausea: Secondary | ICD-10-CM | POA: Diagnosis not present

## 2017-10-18 DIAGNOSIS — Z79899 Other long term (current) drug therapy: Secondary | ICD-10-CM | POA: Insufficient documentation

## 2017-10-18 DIAGNOSIS — D696 Thrombocytopenia, unspecified: Secondary | ICD-10-CM | POA: Diagnosis not present

## 2017-10-18 DIAGNOSIS — R42 Dizziness and giddiness: Secondary | ICD-10-CM | POA: Diagnosis not present

## 2017-10-18 DIAGNOSIS — I803 Phlebitis and thrombophlebitis of lower extremities, unspecified: Secondary | ICD-10-CM | POA: Diagnosis not present

## 2017-10-18 DIAGNOSIS — R404 Transient alteration of awareness: Secondary | ICD-10-CM | POA: Diagnosis not present

## 2017-10-18 LAB — CBC
HCT: 41.3 % (ref 36.0–46.0)
Hemoglobin: 13.6 g/dL (ref 12.0–15.0)
MCH: 31.5 pg (ref 26.0–34.0)
MCHC: 32.9 g/dL (ref 30.0–36.0)
MCV: 95.6 fL (ref 78.0–100.0)
PLATELETS: 135 10*3/uL — AB (ref 150–400)
RBC: 4.32 MIL/uL (ref 3.87–5.11)
RDW: 14.6 % (ref 11.5–15.5)
WBC: 9.5 10*3/uL (ref 4.0–10.5)

## 2017-10-18 LAB — URINALYSIS, ROUTINE W REFLEX MICROSCOPIC
Bacteria, UA: NONE SEEN
Bilirubin Urine: NEGATIVE
GLUCOSE, UA: NEGATIVE mg/dL
HGB URINE DIPSTICK: NEGATIVE
Ketones, ur: NEGATIVE mg/dL
Leukocytes, UA: NEGATIVE
Nitrite: NEGATIVE
Protein, ur: 30 mg/dL — AB
SPECIFIC GRAVITY, URINE: 1.021 (ref 1.005–1.030)
pH: 6 (ref 5.0–8.0)

## 2017-10-18 LAB — BASIC METABOLIC PANEL
Anion gap: 7 (ref 5–15)
BUN: 16 mg/dL (ref 6–20)
CALCIUM: 8.1 mg/dL — AB (ref 8.9–10.3)
CO2: 24 mmol/L (ref 22–32)
CREATININE: 0.93 mg/dL (ref 0.44–1.00)
Chloride: 107 mmol/L (ref 101–111)
GLUCOSE: 92 mg/dL (ref 65–99)
Potassium: 4.9 mmol/L (ref 3.5–5.1)
Sodium: 138 mmol/L (ref 135–145)

## 2017-10-18 LAB — I-STAT BETA HCG BLOOD, ED (MC, WL, AP ONLY): I-stat hCG, quantitative: <5 m[IU]/mL (ref <5)

## 2017-10-18 LAB — CBG MONITORING, ED: Glucose-Capillary: 85 mg/dL (ref 65–99)

## 2017-10-18 MED ORDER — SODIUM CHLORIDE 0.9 % IV BOLUS (SEPSIS)
2000.0000 mL | Freq: Once | INTRAVENOUS | Status: AC
  Administered 2017-10-18: 2000 mL via INTRAVENOUS

## 2017-10-18 MED ORDER — SODIUM CHLORIDE 0.9 % IV SOLN: INTRAVENOUS | Status: DC

## 2017-10-18 NOTE — ED Triage Notes (Signed)
Per EMS, states she was urinating and stood up and became dizzy-mother was present and caught her-laying flat was 70/50 with EMS-becomes dizzy when changing positions and standing-250 cc of NS given in route-symptoms have occurred before-patient was found to be dehydrated and symptoms resolved with fluids-denies pain, no V/D

## 2017-10-18 NOTE — ED Provider Notes (Signed)
[facility] DEPT Provider Note   CSN: [id number] Arrival date & time: 10/18/17  1031     History   Chief Complaint Chief Complaint  Patient presents with  . Near Syncope    HPI INDI Nicole Wilson is a 46 y.o. female.  46 year old female with history of Down syndrome as well as bradycardia presents after having near syncope just prior to arrival.  According to patient and family, she has had no recent illnesses.  Review of the old record shows that she has had similar episodes of this.  She was in the bathroom and became dizzy and lightheaded and sat on the ground.  No injury from this episode.  No seizure activity noted.  EMS was called and patient's blood pressure was 70/50.  Patient is not have any history of hypertension does not take any antihypertensives.  Was given fluids and transported here      Past Medical History:  Diagnosis Date  . Allergic rhinitis   . Bradycardia   . Carotid artery occlusion    bilateral carotid artery bypass for Moya Moya  . Cervical spondylosis   . Down's syndrome   . Dyslipidemia   . Hidradenitis   . Hyperlipidemia   . Hypothyroidism   . MVP (mitral valve prolapse)    resolved on echo 2016  . Obesity (BMI 30-39.9) 06/01/2017  . Orthostatic hypotension     Patient Active Problem List   Diagnosis Date Noted  . Obesity (BMI 30-39.9) 06/01/2017  . HLD (hyperlipidemia) 11/25/2015  . Spondylolisthesis of lumbosacral region 11/25/2015  . Cervical stenosis of spinal canal 03/16/2014  . Chronic chest wall pain 10/06/2013  . Bradycardia   . Carotid artery occlusion   . MVP (mitral valve prolapse)   . Hypothyroidism   . Orthostatic hypotension   . Down syndrome   . Hidradenitis   . Dyslipidemia   . Allergic rhinitis   . Moyamoya disease 03/03/2012    Past Surgical History:  Procedure Laterality Date  . carotid artery surgery    . MIDDLE EAR SURGERY    . TONSILLECTOMY AND ADENOIDECTOMY      OB History      No data available       Home Medications    Prior to Admission medications   Medication Sig Start Date End Date Taking? Authorizing Provider  doxycycline (VIBRAMYCIN) 100 MG capsule Take 100 mg by mouth 2 (two) times daily. 10/14/17  Yes [provider]  ibuprofen (ADVIL,MOTRIN) 200 MG tablet Take 200 mg by mouth as needed.   Yes [provider]  levothyroxine (SYNTHROID, LEVOTHROID) 100 MCG tablet Take 50 mcg by mouth daily.  07/19/13  Yes [provider]  Multiple Vitamin (MULTIVITAMIN) tablet Take 1 tablet by mouth daily.   Yes [provider]  tobramycin-dexamethasone [facility]) ophthalmic solution Place 2 drops into both ears 2 (two) times daily. 09/20/17  Yes [provider]    Family History Family History  Problem Relation Age of Onset  . Multiple myeloma Father   . Prostate cancer Father   . Heart attack Neg Hx   . Stroke Neg Hx     Social History Social History   Tobacco Use  . Smoking status: Never Smoker  . Smokeless tobacco: Never Used  Substance Use Topics  . Alcohol use: No  . Drug use: No     Allergies   Cephalexin and Dilantin [phenytoin]   Review of Systems Review of Systems  All other systems  reviewed and are negative.    Physical Exam Updated Vital Signs BP 96/65   Pulse 97   Temp 98.1 F (36.7 C) (Oral)   Resp 15   SpO2 99%   Physical Exam  Constitutional: She is oriented to person, place, and time. She appears well-developed and well-nourished.  Non-toxic appearance. No distress.  HENT:  Head: Normocephalic and atraumatic.  Eyes: Conjunctivae, EOM and lids are normal. Pupils are equal, round, and reactive to light.  Neck: Normal range of motion. Neck supple. No tracheal deviation present. No thyroid mass present.  Cardiovascular: Normal rate, regular rhythm and normal heart sounds. Exam reveals no gallop.  No murmur heard. Pulmonary/Chest: Effort normal and breath sounds normal. No  stridor. No respiratory distress. She has no decreased breath sounds. She has no wheezes. She has no rhonchi. She has no rales.  Abdominal: Soft. Normal appearance and bowel sounds are normal. She exhibits no distension. There is no tenderness. There is no rebound and no CVA tenderness.  Musculoskeletal: Normal range of motion. She exhibits no edema or tenderness.  Neurological: She is alert and oriented to person, place, and time. She has normal strength. No cranial nerve deficit or sensory deficit. GCS eye subscore is 4. GCS verbal subscore is 5. GCS motor subscore is 6.  Skin: Skin is warm and dry. No abrasion and no rash noted.  Psychiatric: She has a normal mood and affect. Her speech is normal and behavior is normal.  Nursing note and vitals reviewed.    ED Treatments / Results  Labs (all labs ordered are listed, but only abnormal results are displayed) Labs Reviewed  BASIC METABOLIC PANEL  CBC  URINALYSIS, ROUTINE W REFLEX MICROSCOPIC  CBG MONITORING, ED  I-STAT BETA HCG BLOOD, ED ([facility], [facility], [facility] ONLY)    EKG  EKG Interpretation None       Radiology No results found.  Procedures Procedures (including critical care time)  Medications Ordered in ED Medications  sodium chloride 0.9 % bolus 2,000 mL (not administered)  0.9 %  sodium chloride infusion (not administered)     Initial Impression / Assessment and Plan / ED Course  I have reviewed the triage vital signs and the nursing notes.  Pertinent labs & imaging results that were available during my care of the patient were reviewed by me and considered in my medical decision making (see chart for details).     Patient given IV fluids here and feels better.  She is not orthostatic.  Family did noted that the patient does have a history of anxiety.  Will discharge home  Final Clinical Impressions(s) / ED Diagnoses   Final diagnoses:  None    ED Discharge Orders    None       Lacretia Leigh, MD 10/18/17  1501

## 2017-10-18 NOTE — ED Notes (Signed)
Bed: WA21 Expected date:  Expected time:  Means of arrival:  Comments: Triage 4

## 2017-10-19 ENCOUNTER — Inpatient Hospital Stay (HOSPITAL_COMMUNITY)
Admission: EM | Admit: 2017-10-19 | Discharge: 2017-10-22 | DRG: 270 | Disposition: A | Discharge status: Home / Self Care | Payer: Medicare Other | Attending: Family Medicine | Admitting: Family Medicine

## 2017-10-19 ENCOUNTER — Encounter (HOSPITAL_COMMUNITY): Payer: Self-pay | Admitting: Emergency Medicine

## 2017-10-19 ENCOUNTER — Emergency Department (HOSPITAL_BASED_OUTPATIENT_CLINIC_OR_DEPARTMENT_OTHER)
Admit: 2017-10-19 | Discharge: 2017-10-19 | Disposition: A | Discharge status: Home / Self Care | Payer: Medicare Other | Attending: Emergency Medicine | Admitting: Emergency Medicine

## 2017-10-19 DIAGNOSIS — I82412 Acute embolism and thrombosis of left femoral vein: Secondary | ICD-10-CM | POA: Diagnosis not present

## 2017-10-19 DIAGNOSIS — Z792 Long term (current) use of antibiotics: Secondary | ICD-10-CM

## 2017-10-19 DIAGNOSIS — Z79890 Hormone replacement therapy (postmenopausal): Secondary | ICD-10-CM

## 2017-10-19 DIAGNOSIS — I82422 Acute embolism and thrombosis of left iliac vein: Secondary | ICD-10-CM | POA: Diagnosis present

## 2017-10-19 DIAGNOSIS — Z807 Family history of other malignant neoplasms of lymphoid, hematopoietic and related tissues: Secondary | ICD-10-CM

## 2017-10-19 DIAGNOSIS — M7989 Other specified soft tissue disorders: Secondary | ICD-10-CM

## 2017-10-19 DIAGNOSIS — E039 Hypothyroidism, unspecified: Secondary | ICD-10-CM | POA: Diagnosis present

## 2017-10-19 DIAGNOSIS — Z7952 Long term (current) use of systemic steroids: Secondary | ICD-10-CM

## 2017-10-19 DIAGNOSIS — Z8679 Personal history of other diseases of the circulatory system: Secondary | ICD-10-CM

## 2017-10-19 DIAGNOSIS — I2699 Other pulmonary embolism without acute cor pulmonale: Secondary | ICD-10-CM | POA: Diagnosis not present

## 2017-10-19 DIAGNOSIS — I82432 Acute embolism and thrombosis of left popliteal vein: Secondary | ICD-10-CM

## 2017-10-19 DIAGNOSIS — Z888 Allergy status to other drugs, medicaments and biological substances status: Secondary | ICD-10-CM

## 2017-10-19 DIAGNOSIS — I803 Phlebitis and thrombophlebitis of lower extremities, unspecified: Secondary | ICD-10-CM | POA: Diagnosis not present

## 2017-10-19 DIAGNOSIS — M79609 Pain in unspecified limb: Secondary | ICD-10-CM | POA: Diagnosis not present

## 2017-10-19 DIAGNOSIS — I2609 Other pulmonary embolism with acute cor pulmonale: Secondary | ICD-10-CM | POA: Diagnosis present

## 2017-10-19 DIAGNOSIS — I80202 Phlebitis and thrombophlebitis of unspecified deep vessels of left lower extremity: Secondary | ICD-10-CM | POA: Diagnosis present

## 2017-10-19 DIAGNOSIS — D696 Thrombocytopenia, unspecified: Secondary | ICD-10-CM | POA: Diagnosis present

## 2017-10-19 DIAGNOSIS — E8809 Other disorders of plasma-protein metabolism, not elsewhere classified: Secondary | ICD-10-CM | POA: Diagnosis present

## 2017-10-19 DIAGNOSIS — Z881 Allergy status to other antibiotic agents status: Secondary | ICD-10-CM

## 2017-10-19 DIAGNOSIS — I82409 Acute embolism and thrombosis of unspecified deep veins of unspecified lower extremity: Secondary | ICD-10-CM

## 2017-10-19 DIAGNOSIS — Q909 Down syndrome, unspecified: Secondary | ICD-10-CM

## 2017-10-19 DIAGNOSIS — I82402 Acute embolism and thrombosis of unspecified deep veins of left lower extremity: Secondary | ICD-10-CM | POA: Diagnosis present

## 2017-10-19 DIAGNOSIS — Z8042 Family history of malignant neoplasm of prostate: Secondary | ICD-10-CM

## 2017-10-19 LAB — COMPREHENSIVE METABOLIC PANEL
ALK PHOS: 71 U/L (ref 38–126)
ALT: 13 U/L — ABNORMAL LOW (ref 14–54)
AST: 29 U/L (ref 15–41)
Albumin: 2.8 g/dL — ABNORMAL LOW (ref 3.5–5.0)
Anion gap: 5 (ref 5–15)
BILIRUBIN TOTAL: 0.4 mg/dL (ref 0.3–1.2)
BUN: 11 mg/dL (ref 6–20)
CALCIUM: 8.3 mg/dL — AB (ref 8.9–10.3)
CO2: 24 mmol/L (ref 22–32)
CREATININE: 0.96 mg/dL (ref 0.44–1.00)
Chloride: 109 mmol/L (ref 101–111)
GFR calc Af Amer: >60 mL/min (ref >60)
Glucose, Bld: 101 mg/dL — ABNORMAL HIGH (ref 65–99)
POTASSIUM: 3.9 mmol/L (ref 3.5–5.1)
Sodium: 138 mmol/L (ref 135–145)
TOTAL PROTEIN: 6.1 g/dL — AB (ref 6.5–8.1)

## 2017-10-19 LAB — CBC WITH DIFFERENTIAL/PLATELET
Basophils Absolute: 0 10*3/uL (ref 0.0–0.1)
Basophils Relative: 0 %
EOS PCT: 1 %
Eosinophils Absolute: 0.1 10*3/uL (ref 0.0–0.7)
HEMATOCRIT: 39.2 % (ref 36.0–46.0)
HEMOGLOBIN: 12.9 g/dL (ref 12.0–15.0)
LYMPHS ABS: 1.4 10*3/uL (ref 0.7–4.0)
LYMPHS PCT: 14 %
MCH: 31.2 pg (ref 26.0–34.0)
MCHC: 32.9 g/dL (ref 30.0–36.0)
MCV: 94.7 fL (ref 78.0–100.0)
Monocytes Absolute: 0.6 10*3/uL (ref 0.1–1.0)
Monocytes Relative: 6 %
Neutro Abs: 8 10*3/uL — ABNORMAL HIGH (ref 1.7–7.7)
Neutrophils Relative %: 79 %
Platelets: 122 10*3/uL — ABNORMAL LOW (ref 150–400)
RBC: 4.14 MIL/uL (ref 3.87–5.11)
RDW: 14.7 % (ref 11.5–15.5)
WBC: 10 10*3/uL (ref 4.0–10.5)

## 2017-10-19 LAB — I-STAT CG4 LACTIC ACID, ED: Lactic Acid, Venous: 0.83 mmol/L (ref 0.5–1.9)

## 2017-10-19 NOTE — ED Provider Notes (Signed)
Patient presents with swelling and redness of left leg from mid-thigh to foot, noticed this morning. History of DVT's. Area of redness with mildly increased warmth, small abrasion noted on left knee. DVT vs cellulitis, venous doppler ordered.   Etta Quill, NP 10/19/17 Jac Canavan, MD 10/20/17 [phone]

## 2017-10-19 NOTE — Progress Notes (Signed)
Left lower extremity venous duplex completed. Positive for an acute occlusive DVT coursing from the popliteal vein through the common femoral vein. Unable to adequately image the external iliac vein due to sub optimal positioning. Also noted is a superficial thrombosis of the proxiaml greater saphenous vein. No evidence of a Baker's cyst. Toma Copier, RVS 10/19/2017 7:29 PM

## 2017-10-19 NOTE — ED Triage Notes (Signed)
Pt presents to ER with L leg swelling and pain; family reports hx of blood clots; L upper leg red and swollen from hip to ankle

## 2017-10-20 ENCOUNTER — Inpatient Hospital Stay (HOSPITAL_COMMUNITY): Payer: Medicare Other

## 2017-10-20 ENCOUNTER — Other Ambulatory Visit (HOSPITAL_COMMUNITY): Payer: Self-pay

## 2017-10-20 ENCOUNTER — Encounter (HOSPITAL_COMMUNITY): Payer: Self-pay | Admitting: Family Medicine

## 2017-10-20 ENCOUNTER — Emergency Department (HOSPITAL_COMMUNITY): Payer: Medicare Other

## 2017-10-20 ENCOUNTER — Other Ambulatory Visit: Payer: Self-pay

## 2017-10-20 DIAGNOSIS — I80202 Phlebitis and thrombophlebitis of unspecified deep vessels of left lower extremity: Secondary | ICD-10-CM | POA: Diagnosis not present

## 2017-10-20 DIAGNOSIS — I2609 Other pulmonary embolism with acute cor pulmonale: Secondary | ICD-10-CM | POA: Diagnosis not present

## 2017-10-20 DIAGNOSIS — E039 Hypothyroidism, unspecified: Secondary | ICD-10-CM

## 2017-10-20 DIAGNOSIS — E8809 Other disorders of plasma-protein metabolism, not elsewhere classified: Secondary | ICD-10-CM | POA: Diagnosis present

## 2017-10-20 DIAGNOSIS — I824Z2 Acute embolism and thrombosis of unspecified deep veins of left distal lower extremity: Secondary | ICD-10-CM

## 2017-10-20 DIAGNOSIS — I82412 Acute embolism and thrombosis of left femoral vein: Secondary | ICD-10-CM | POA: Diagnosis not present

## 2017-10-20 DIAGNOSIS — Z79890 Hormone replacement therapy (postmenopausal): Secondary | ICD-10-CM | POA: Diagnosis not present

## 2017-10-20 DIAGNOSIS — I82432 Acute embolism and thrombosis of left popliteal vein: Secondary | ICD-10-CM | POA: Diagnosis not present

## 2017-10-20 DIAGNOSIS — D696 Thrombocytopenia, unspecified: Secondary | ICD-10-CM | POA: Diagnosis present

## 2017-10-20 DIAGNOSIS — Z7952 Long term (current) use of systemic steroids: Secondary | ICD-10-CM | POA: Diagnosis not present

## 2017-10-20 DIAGNOSIS — Z888 Allergy status to other drugs, medicaments and biological substances status: Secondary | ICD-10-CM | POA: Diagnosis not present

## 2017-10-20 DIAGNOSIS — I82402 Acute embolism and thrombosis of unspecified deep veins of left lower extremity: Secondary | ICD-10-CM | POA: Diagnosis not present

## 2017-10-20 DIAGNOSIS — Z8042 Family history of malignant neoplasm of prostate: Secondary | ICD-10-CM | POA: Diagnosis not present

## 2017-10-20 DIAGNOSIS — I82422 Acute embolism and thrombosis of left iliac vein: Secondary | ICD-10-CM | POA: Diagnosis not present

## 2017-10-20 DIAGNOSIS — Z881 Allergy status to other antibiotic agents status: Secondary | ICD-10-CM | POA: Diagnosis not present

## 2017-10-20 DIAGNOSIS — I2601 Septic pulmonary embolism with acute cor pulmonale: Secondary | ICD-10-CM | POA: Diagnosis not present

## 2017-10-20 DIAGNOSIS — I82409 Acute embolism and thrombosis of unspecified deep veins of unspecified lower extremity: Secondary | ICD-10-CM | POA: Diagnosis not present

## 2017-10-20 DIAGNOSIS — I361 Nonrheumatic tricuspid (valve) insufficiency: Secondary | ICD-10-CM | POA: Diagnosis not present

## 2017-10-20 DIAGNOSIS — Z792 Long term (current) use of antibiotics: Secondary | ICD-10-CM | POA: Diagnosis not present

## 2017-10-20 DIAGNOSIS — Z8679 Personal history of other diseases of the circulatory system: Secondary | ICD-10-CM | POA: Diagnosis not present

## 2017-10-20 DIAGNOSIS — I2699 Other pulmonary embolism without acute cor pulmonale: Secondary | ICD-10-CM | POA: Diagnosis not present

## 2017-10-20 DIAGNOSIS — Z807 Family history of other malignant neoplasms of lymphoid, hematopoietic and related tissues: Secondary | ICD-10-CM | POA: Diagnosis not present

## 2017-10-20 DIAGNOSIS — Q909 Down syndrome, unspecified: Secondary | ICD-10-CM | POA: Diagnosis not present

## 2017-10-20 HISTORY — PX: IR VENOCAVAGRAM IVC: IMG678

## 2017-10-20 HISTORY — PX: IR US GUIDE VASC ACCESS LEFT: IMG2389

## 2017-10-20 HISTORY — PX: IR THROMBECT VENO MECH MOD SED: IMG2300

## 2017-10-20 HISTORY — PX: IR VENO/EXT/UNI LEFT: IMG675

## 2017-10-20 LAB — CBC
HCT: 36.2 % (ref 36.0–46.0)
Hemoglobin: 11.9 g/dL — ABNORMAL LOW (ref 12.0–15.0)
MCH: 31.6 pg (ref 26.0–34.0)
MCHC: 32.9 g/dL (ref 30.0–36.0)
MCV: 96 fL (ref 78.0–100.0)
PLATELETS: 105 10*3/uL — AB (ref 150–400)
RBC: 3.77 MIL/uL — AB (ref 3.87–5.11)
RDW: 15 % (ref 11.5–15.5)
WBC: 8.9 10*3/uL (ref 4.0–10.5)

## 2017-10-20 LAB — I-STAT TROPONIN, ED: TROPONIN I, POC: 0.29 ng/mL — AB (ref 0.00–0.08)

## 2017-10-20 LAB — I-STAT CG4 LACTIC ACID, ED: LACTIC ACID, VENOUS: 1.3 mmol/L (ref 0.5–1.9)

## 2017-10-20 LAB — TROPONIN I
TROPONIN I: 0.24 ng/mL — AB (ref <0.03)
Troponin I: 0.28 ng/mL (ref <0.03)
Troponin I: 0.16 ng/mL (ref <0.03)

## 2017-10-20 LAB — BRAIN NATRIURETIC PEPTIDE: B NATRIURETIC PEPTIDE 5: 149.1 pg/mL — AB (ref 0.0–100.0)

## 2017-10-20 LAB — HEPARIN LEVEL (UNFRACTIONATED)
HEPARIN UNFRACTIONATED: 0.56 [IU]/mL (ref 0.30–0.70)
Heparin Unfractionated: 0.12 IU/mL — ABNORMAL LOW (ref 0.30–0.70)

## 2017-10-20 LAB — TYPE AND SCREEN
ABO/RH(D): A POS
Antibody Screen: NEGATIVE

## 2017-10-20 LAB — MRSA PCR SCREENING: MRSA by PCR: NEGATIVE

## 2017-10-20 LAB — ABO/RH: ABO/RH(D): A POS

## 2017-10-20 MED ORDER — LIDOCAINE HCL 1 % IJ SOLN
INTRAMUSCULAR | Status: AC
  Filled 2017-10-20: qty 20

## 2017-10-20 MED ORDER — ACETAMINOPHEN 650 MG RE SUPP: 650.0000 mg | Freq: Four times a day (QID) | RECTAL | Status: DC | PRN

## 2017-10-20 MED ORDER — TOBRAMYCIN-DEXAMETHASONE 0.3-0.1 % OP SUSP
2.0000 [drp] | Freq: Two times a day (BID) | OPHTHALMIC | Status: DC
  Administered 2017-10-20 – 2017-10-22 (×4): 2 [drp] via OTIC
  Filled 2017-10-20: qty 2.5

## 2017-10-20 MED ORDER — BISACODYL 5 MG PO TBEC: 5.0000 mg | DELAYED_RELEASE_TABLET | Freq: Every day | ORAL | Status: DC | PRN

## 2017-10-20 MED ORDER — MIDAZOLAM HCL 2 MG/2ML IJ SOLN
INTRAMUSCULAR | Status: AC | PRN
  Administered 2017-10-20 (×2): 1 mg via INTRAVENOUS

## 2017-10-20 MED ORDER — MIDAZOLAM HCL 2 MG/2ML IJ SOLN
INTRAMUSCULAR | Status: AC
  Filled 2017-10-20: qty 6

## 2017-10-20 MED ORDER — LEVOTHYROXINE SODIUM 50 MCG PO TABS
50.0000 ug | ORAL_TABLET | Freq: Every day | ORAL | Status: DC
  Administered 2017-10-20 – 2017-10-22 (×3): 50 ug via ORAL
  Filled 2017-10-20 (×3): qty 1

## 2017-10-20 MED ORDER — DOXYCYCLINE HYCLATE 100 MG PO TABS
100.0000 mg | ORAL_TABLET | Freq: Two times a day (BID) | ORAL | Status: DC
  Administered 2017-10-20: 100 mg via ORAL
  Filled 2017-10-20: qty 1

## 2017-10-20 MED ORDER — IOPAMIDOL (ISOVUE-300) INJECTION 61%
INTRAVENOUS | Status: AC
  Administered 2017-10-20: 60 mL
  Filled 2017-10-20: qty 100

## 2017-10-20 MED ORDER — FENTANYL CITRATE (PF) 100 MCG/2ML IJ SOLN
INTRAMUSCULAR | Status: AC | PRN
  Administered 2017-10-20 (×2): 25 ug via INTRAVENOUS

## 2017-10-20 MED ORDER — HEPARIN BOLUS VIA INFUSION
3000.0000 [IU] | Freq: Once | INTRAVENOUS | Status: AC
  Administered 2017-10-20: 3000 [IU] via INTRAVENOUS
  Filled 2017-10-20: qty 3000

## 2017-10-20 MED ORDER — ACETAMINOPHEN 325 MG PO TABS: 650.0000 mg | ORAL_TABLET | Freq: Four times a day (QID) | ORAL | Status: DC | PRN

## 2017-10-20 MED ORDER — IOPAMIDOL (ISOVUE-370) INJECTION 76%
INTRAVENOUS | Status: AC
  Administered 2017-10-20: 100 mL
  Filled 2017-10-20: qty 100

## 2017-10-20 MED ORDER — ADULT MULTIVITAMIN W/MINERALS CH
1.0000 | ORAL_TABLET | Freq: Every day | ORAL | Status: DC
  Administered 2017-10-21 – 2017-10-22 (×2): 1 via ORAL
  Filled 2017-10-20 (×3): qty 1

## 2017-10-20 MED ORDER — IOPAMIDOL (ISOVUE-300) INJECTION 61%
INTRAVENOUS | Status: AC
  Administered 2017-10-20: 60 mL
  Filled 2017-10-20: qty 150

## 2017-10-20 MED ORDER — HEPARIN BOLUS VIA INFUSION
1500.0000 [IU] | Freq: Once | INTRAVENOUS | Status: AC
  Administered 2017-10-20: 1500 [IU] via INTRAVENOUS
  Filled 2017-10-20: qty 1500

## 2017-10-20 MED ORDER — LIDOCAINE HCL 1 % IJ SOLN
INTRAMUSCULAR | Status: AC | PRN
  Administered 2017-10-20: 10 mL

## 2017-10-20 MED ORDER — SODIUM CHLORIDE 0.9% FLUSH
3.0000 mL | Freq: Two times a day (BID) | INTRAVENOUS | Status: DC
  Administered 2017-10-21: 3 mL via INTRAVENOUS

## 2017-10-20 MED ORDER — ONDANSETRON HCL 4 MG/2ML IJ SOLN: 4.0000 mg | Freq: Four times a day (QID) | INTRAMUSCULAR | Status: DC | PRN

## 2017-10-20 MED ORDER — SODIUM CHLORIDE 0.9 % IV SOLN
INTRAVENOUS | Status: AC
  Administered 2017-10-20: 07:00:00 via INTRAVENOUS

## 2017-10-20 MED ORDER — ONDANSETRON HCL 4 MG PO TABS: 4.0000 mg | ORAL_TABLET | Freq: Four times a day (QID) | ORAL | Status: DC | PRN

## 2017-10-20 MED ORDER — HEPARIN (PORCINE) IN NACL 100-0.45 UNIT/ML-% IJ SOLN
1250.0000 [IU]/h | INTRAMUSCULAR | Status: DC
  Administered 2017-10-20: 1000 [IU]/h via INTRAVENOUS
  Administered 2017-10-20 – 2017-10-21 (×2): 1250 [IU]/h via INTRAVENOUS
  Filled 2017-10-20 (×3): qty 250

## 2017-10-20 MED ORDER — FENTANYL CITRATE (PF) 100 MCG/2ML IJ SOLN
INTRAMUSCULAR | Status: AC
  Filled 2017-10-20: qty 4

## 2017-10-20 MED ORDER — SENNOSIDES-DOCUSATE SODIUM 8.6-50 MG PO TABS: 1.0000 | ORAL_TABLET | Freq: Every evening | ORAL | Status: DC | PRN

## 2017-10-20 MED ORDER — HYDROCODONE-ACETAMINOPHEN 5-325 MG PO TABS
1.0000 | ORAL_TABLET | ORAL | Status: DC | PRN
  Administered 2017-10-20 – 2017-10-21 (×2): 1 via ORAL
  Filled 2017-10-20 (×2): qty 1

## 2017-10-20 NOTE — Progress Notes (Signed)
CRITICAL VALUE ALERT  Critical Value:  Troponin 0.24  Date & Time Notied:  10/20/2017 0615  Provider Notified: yes  Orders Received/Actions taken: trending

## 2017-10-20 NOTE — Consult Note (Signed)
Name: Nicole Wilson MRN: [id number] DOB: 1971-08-26    ADMISSION DATE:  10/19/2017 CONSULTATION DATE:  12/5  REFERRING MD :  Sheliah Plane Wendee Beavers)   CHIEF COMPLAINT:  PE   BRIEF PATIENT DESCRIPTION: 46 year old female with history of Down syndrome, hypothyroidism, MVP who presented 12 4 with left lower extremity swelling, redness, pain, near syncope.  Venous Dopplers revealed acute extensive occlusive DVT in the left lower extremity.  CTA chest was also positive for acute submassive PE with atelectasis versus infarct at the right base.  Patient was started on heparin drip and admitted to stepdown for triad.  Left lower extremity findings were worrisome for phlegmasia and she was taken to IR for left lower extremity thrombectomy 12/5 a.m.  She has otherwise been hemodynamically stable and relatively asymptomatic.  Due to the complexity of her course, PCCM was consulted to assist.  SIGNIFICANT EVENTS  12/5>>IR LLE thrombectomy  STUDIES:  BLE venous Dopplers 12/4 >> Left lower extremity venous duplex completed. Positive for an acute occlusive DVT coursing from the popliteal vein through the common femoral vein. Unable to adequately image the external iliac vein due to sub optimal positioning. Also noted is a superficial thrombosis of the proxiaml greater saphenous vein. No evidence of a Baker's cyst. CTA chest 12/4 >>: 1. Positive for acute PE with CTevidence of right heart strain (RV/LV Ratio = 1.7) consistent with at least submassive (intermediate risk) PE. The presence of right heart strain has been associated with an increased risk of morbidity and mortality. 2. Focal area of atelectasis versus pulmonary infarct at the right lung base.    HISTORY OF PRESENT ILLNESS: 46 year old female with history of Down syndrome, hypothyroidism, MVP who presented 12 4 with left lower extremity swelling, redness, pain, near syncope.  Venous Dopplers revealed acute extensive occlusive DVT in the left lower  extremity.  CTA chest was also positive for acute submassive PE with atelectasis versus infarct at the right base.  Patient was started on heparin drip and admitted to stepdown for triad.  Left lower extremity findings were worrisome for phlegmasia and she was taken to IR for left lower extremity thrombectomy 12/5 a.m.  She has otherwise been hemodynamically stable and relatively asymptomatic.  Due to the complexity of her course, PCCM was consulted to assist.  Currently resting in bed with mom at bedside.  She is beginning to complain of some mild chest pain and cough.  Denies shortness of breath, hemoptysis, syncope, fever.  Some residual left lower extremity pain but improved.    PAST MEDICAL HISTORY :   has a past medical history of Allergic rhinitis, Bradycardia, Carotid artery occlusion, Cervical spondylosis, Down's syndrome, Dyslipidemia, Hidradenitis, Hyperlipidemia, Hypothyroidism, MVP (mitral valve prolapse), Obesity (BMI 30-39.9) (06/01/2017), and Orthostatic hypotension.  has a past surgical history that includes Tonsillectomy and adenoidectomy; Middle ear surgery; and carotid artery surgery. Prior to Admission medications   Medication Sig Start Date End Date Taking? Authorizing Provider  ibuprofen (ADVIL,MOTRIN) 200 MG tablet Take 200 mg by mouth every 6 (six) hours as needed for mild pain.    Yes [provider]  levothyroxine (SYNTHROID, LEVOTHROID) 100 MCG tablet Take 50 mcg by mouth daily.  07/19/13  Yes [provider]  Multiple Vitamin (MULTIVITAMIN) tablet Take 1 tablet by mouth daily.   Yes [provider]  tobramycin-dexamethasone [facility]) ophthalmic solution Place 2 drops into both ears 2 (two) times daily. 09/20/17  Yes [provider]  doxycycline (VIBRAMYCIN) 100 MG capsule Take 100 mg by  mouth 2 (two) times daily. 10/14/17   [provider]   Allergies  Allergen Reactions  . Cephalexin Rash  . Dilantin [Phenytoin] Other (See  Comments)    Pt doesnt remember    FAMILY HISTORY:  family history includes Multiple myeloma in her father; Prostate cancer in her father. SOCIAL HISTORY:  reports that  has never smoked. she has never used smokeless tobacco. She reports that she does not drink alcohol or use drugs.  REVIEW OF SYSTEMS:   As per HPI - All other systems reviewed and were neg.    SUBJECTIVE:   VITAL SIGNS: Temp:  [98.1 F (36.7 C)-98.7 F (37.1 C)] 98.7 F (37.1 C) (12/05 0612) Pulse Rate:  [78-91] 84 (12/05 0612) Resp:  [16-23] 19 (12/05 0612) BP: (86-121)/(54-88) 104/88 (12/05 0742) SpO2:  [92 %-100 %] 100 % (12/05 0612) Weight:  [67 kg (147 lb 11.3 oz)-70 kg (154 lb 5.2 oz)] 70 kg (154 lb 5.2 oz) (12/05 0612)  PHYSICAL EXAMINATION: General: Pleasant female, no acute distress, Down syndrome   Neuro:  awake, alert, appropriate HEENT: MM moist, no JVD  Cardiovascular: S1-S2, regular rate and rhythm, normal sinus rhythm 80s Lungs: Respirations are even and nonlabored on room air, essentially clear Abdomen: Round, soft, nontender Musculoskeletal: Warm and dry, left lower extremity red, warm, tender, left groin site clean and dry   Recent Labs  Lab 10/18/17 1305 10/19/17 1837  NA 138 138  K 4.9 3.9  CL 107 109  CO2 24 24  BUN 16 11  CREATININE 0.93 0.96  GLUCOSE 92 101*   Recent Labs  Lab 10/18/17 1305 10/19/17 1837  HGB 13.6 12.9  HCT 41.3 39.2  WBC 9.5 10.0  PLT 135* 122*   Ct Angio Chest Pe W And/or Wo Contrast  Result Date: 10/20/2017 CLINICAL DATA:  46 year old female with left leg swelling and pain. Concern for pulmonary embolism. EXAM: CT ANGIOGRAPHY CHEST WITH CONTRAST TECHNIQUE: Multidetector CT imaging of the chest was performed using the standard protocol during bolus administration of intravenous contrast. Multiplanar CT image reconstructions and MIPs were obtained to evaluate the vascular anatomy. CONTRAST:  160m ISOVUE-370 IOPAMIDOL (ISOVUE-370) INJECTION 76%  COMPARISON:  None. FINDINGS: Cardiovascular: Bilateral large pulmonary emboli involving the central pulmonary artery is extending into the lobar and segmental branches of the upper and lower lobes bilaterally. There is mild cardiomegaly with dilatation of the right ventricle and right atrium. The right ventricle measures 4.1 cm in diameter and the left ventricle measures 2.4 cm in diameter with a ratio of 1.7. There is a small pericardial effusion measuring 7 mm in thickness. The thoracic aorta is unremarkable. The origins of the great vessels of the aortic arch appear patent. Mediastinum/Nodes: No hilar or mediastinal adenopathy. Esophagus and the thyroid gland are grossly unremarkable as visualized. No mediastinal fluid collection. Lungs/Pleura: Small focal pleural based haziness at the right lung base may represent atelectatic changes or focal area of pulmonary infarct. The lungs are otherwise clear. There is no pleural effusion or pneumothorax. The central airways are patent. Upper Abdomen: No acute abnormality. Musculoskeletal: Degenerative changes of the lower cervical spine. No acute osseous pathology. Review of the MIP images confirms the above findings. IMPRESSION: 1. Positive for acute PE with CTevidence of right heart strain (RV/LV Ratio = 1.7) consistent with at least submassive (intermediate risk) PE. The presence of right heart strain has been associated with an increased risk of morbidity and mortality. 2. Focal area of atelectasis versus pulmonary infarct at the  right lung base. These results were called by telephone at the time of interpretation on 10/20/2017 at 3:22 am to physician assistant [facility] , who verbally acknowledged these results. Electronically Signed   By: Anner Crete M.D.   On: 10/20/2017 03:25    ASSESSMENT / PLAN:  Acute submassive PE Extensive LLE DVT-status post IR thrombectomy  Plan- Continue heparin drip for now  2D echo pending  trend  troponin Monitor hemodynamics-currently hemodynamically stable with no indication for IR intervention or thrombolytics for PE IR following status post left DVT thrombectomy  Discussed with triad Dr. Wendee Beavers PCCM will continue to follow    Pt and mom updated at bedside 12/5  Nickolas Madrid, NP 10/20/2017  9:51 AM Pager: [phone] or [phone]  STAFF NOTE: Linwood Dibbles, MD FACP have personally reviewed patient's available data, including medical history, events of note, physical examination and test results as part of my evaluation. I have discussed with resident/NP and other care providers such as pharmacist, RN and RRT. In addition, I personally evaluated patient and elicited key findings of: awake, talkative, lungs clear maybe slight scattered ronchi expiration apical, jvd wnl, no rt heeve, abdo soft, edema and [place] left lower ext, CT scan chest which shows PE bilateral disatl rt main and more distal then that on left, she is NOT tachy, she is ON RA and BP sys wnl, she presented with extensive clot left lower ext and pe, s/p thomrbectomy left IR, she did have slight trop elevation but appears well clinically, maintain heparin, I do not feel she requires ekos given above , ratio also 1.7, and excellent clinical status, I don't think the catheter would reach any clot on left and maybe some on rt but has no true benefit, would maintain heparin, to oral agent in am , needs ambulation in am and pulse ox check, her father had HITT and clot but she has no other family history related to this, assess, plat is down and appears to be consumption from clot, echo assess rv, assess homocysteine level, ana, [place] [address] gene mutation, lups anticag, and then as outpt in month remainder congenital work up  [employer]. Titus Mould, MD, [place] Pgr: [facility] 10/20/2017 2:14 PM

## 2017-10-20 NOTE — Progress Notes (Signed)
Patient seen and evaluated earlier this am by my associate. Agree with plan as oulined in his H and P. Please review for details regarding history, assessment, and plan.  IR and Critical care team consulted.  Will reassess next am.  Gen: pt in nad, alert and awake CV: no cyanosis Pulm: equal chest rise, no accessory muscle use.  Nicole Wilson, [employer]

## 2017-10-20 NOTE — Progress Notes (Signed)
Patient's sister wanted to talk MD regarding pt's condition such as pt had vomited in the morning and problem to swallow the pills and her speech is not clear than usual. Patient has no problem with swallow food, but need to slow down to have food. Explained pt and family member. Changed dressing on Lt. Leg with band-aid. HS [employer]

## 2017-10-20 NOTE — ED Notes (Signed)
Radiologist rounding at bedside

## 2017-10-20 NOTE — Progress Notes (Signed)
Patient ID: Nicole Wilson, female   DOB: 04-Jul-1971, 46 y.o.   MRN: [id number]    Referring Physician(s): Dr. Etta Quill  Supervising Physician: Sandi Mariscal  Patient Status: [facility] - In-pt  Chief Complaint: LLE DVT  Subjective: Patient is feeling better this morning and says her leg doesn't hurt as much as last night.  Her parents are in the room and state she is doing better.  Allergies: Cephalexin and Dilantin [phenytoin]  Medications: Prior to Admission medications   Medication Sig Start Date End Date Taking? Authorizing Provider  ibuprofen (ADVIL,MOTRIN) 200 MG tablet Take 200 mg by mouth every 6 (six) hours as needed for mild pain.    Yes [provider]  levothyroxine (SYNTHROID, LEVOTHROID) 100 MCG tablet Take 50 mcg by mouth daily.  07/19/13  Yes [provider]  Multiple Vitamin (MULTIVITAMIN) tablet Take 1 tablet by mouth daily.   Yes [provider]  tobramycin-dexamethasone [facility]) ophthalmic solution Place 2 drops into both ears 2 (two) times daily. 09/20/17  Yes [provider]  doxycycline (VIBRAMYCIN) 100 MG capsule Take 100 mg by mouth 2 (two) times daily. 10/14/17   [provider]    Vital Signs: BP 104/88   Pulse (!) 40   Temp 98.7 F (37.1 C) (Oral)   Resp 16   Ht 4\' 11"  (1.499 m)   Wt 154 lb 5.2 oz (70 kg)   SpO2 91%   BMI 31.17 kg/m   Physical Exam: Ext: LLE still with some edema, but her leg is pink and warm.  It is nontender to touch today.  Posterior pop site is c/d/i  Imaging: Ct Angio Chest Pe W And/or Wo Contrast  Result Date: 10/20/2017 CLINICAL DATA:  46 year old female with left leg swelling and pain. Concern for pulmonary embolism. EXAM: CT ANGIOGRAPHY CHEST WITH CONTRAST TECHNIQUE: Multidetector CT imaging of the chest was performed using the standard protocol during bolus administration of intravenous contrast. Multiplanar CT image reconstructions and MIPs were obtained to evaluate the vascular  anatomy. CONTRAST:  130mL ISOVUE-370 IOPAMIDOL (ISOVUE-370) INJECTION 76% COMPARISON:  None. FINDINGS: Cardiovascular: Bilateral large pulmonary emboli involving the central pulmonary artery is extending into the lobar and segmental branches of the upper and lower lobes bilaterally. There is mild cardiomegaly with dilatation of the right ventricle and right atrium. The right ventricle measures 4.1 cm in diameter and the left ventricle measures 2.4 cm in diameter with a ratio of 1.7. There is a small pericardial effusion measuring 7 mm in thickness. The thoracic aorta is unremarkable. The origins of the great vessels of the aortic arch appear patent. Mediastinum/Nodes: No hilar or mediastinal adenopathy. Esophagus and the thyroid gland are grossly unremarkable as visualized. No mediastinal fluid collection. Lungs/Pleura: Small focal pleural based haziness at the right lung base may represent atelectatic changes or focal area of pulmonary infarct. The lungs are otherwise clear. There is no pleural effusion or pneumothorax. The central airways are patent. Upper Abdomen: No acute abnormality. Musculoskeletal: Degenerative changes of the lower cervical spine. No acute osseous pathology. Review of the MIP images confirms the above findings. IMPRESSION: 1. Positive for acute PE with CTevidence of right heart strain (RV/LV Ratio = 1.7) consistent with at least submassive (intermediate risk) PE. The presence of right heart strain has been associated with an increased risk of morbidity and mortality. 2. Focal area of atelectasis versus pulmonary infarct at the right lung base. These results were called by telephone at the time of interpretation on 10/20/2017 at  3:22 am to physician assistant [facility] , who verbally acknowledged these results. Electronically Signed   By: Anner Crete M.D.   On: 10/20/2017 03:25   Ir Veno/ext/uni Left  Result Date: 10/20/2017 INDICATION: Left lower extremity Phlegmasia EXAM:  LEFT LOWER EXTREMITY VENOUS THROMBECTOMY COMPARISON:  None. MEDICATIONS: None. 120 cc Isovue-300. ANESTHESIA/SEDATION: Versed 2 mg IV; Fentanyl 50 mcg IV Moderate Sedation Time:  46 minutes The patient was continuously monitored during the procedure by the interventional radiology nurse under my direct supervision. FLUOROSCOPY TIME:  Fluoroscopy Time: 11 minutes 30 seconds seconds (157 mGy). COMPLICATIONS: None immediate. TECHNIQUE: Informed written consent was obtained from the patient after a thorough discussion of the procedural risks, benefits and alternatives. All questions were addressed. Maximal Sterile Barrier Technique was utilized including caps, mask, sterile gowns, sterile gloves, sterile drape, hand hygiene and skin antiseptic. A timeout was performed prior to the initiation of the procedure. In the prone position, the left popliteal fossa was prepped and draped in a sterile fashion. 1% lidocaine was utilized for local anesthesia. Under sonographic guidance, a micropuncture needle was inserted into the popliteal vein and removed over a 018 wire which was up sized to a Bentson. An 8 French sheath was inserted. Contrast was injected for venography. A vertebral catheter was then advanced over the Bentson wire and stepwise venography was performed to the IVC. Occlusive thrombus was noted in the upper femoral vein, common femoral vein, and lower left external iliac vein. Angiojet was utilized for thrombectomy of the DVT. Repeat venography demonstrates some residual thrombus in the common femoral vein The common femoral vein was then dilated to 12 mm within angioplasty device and final imaging was obtained. The sheath was removed and hemostasis was achieved with direct pressure and a hemostatic pad. FINDINGS: Initial venography demonstrates occlusive thrombus in the upper femoral vein, left common femoral vein, and lower left external iliac vein. The popliteal vein was patent. The mid and lower femoral vein  were patent. The left common iliac faint and IVC were widely patent. After thrombectomy, there was marked improvement in the thrombus burden with some residual thrombus in the common femoral vein After angioplasty of the common femoral vein to 12 mm, final imaging demonstrates marked improvement with brisk antegrade flow and some residual narrowing in the left common femoral vein. IMPRESSION: Successful thrombectomy of DVT within the left external iliac vein, common femoral vein, and upper femoral vein. Electronically Signed   By: Marybelle Killings M.D.   On: 10/20/2017 09:52   Ir Mariana Arn Ivc  Result Date: 10/20/2017 INDICATION: Left lower extremity Phlegmasia EXAM: LEFT LOWER EXTREMITY VENOUS THROMBECTOMY COMPARISON:  None. MEDICATIONS: None. 120 cc Isovue-300. ANESTHESIA/SEDATION: Versed 2 mg IV; Fentanyl 50 mcg IV Moderate Sedation Time:  46 minutes The patient was continuously monitored during the procedure by the interventional radiology nurse under my direct supervision. FLUOROSCOPY TIME:  Fluoroscopy Time: 11 minutes 30 seconds seconds (157 mGy). COMPLICATIONS: None immediate. TECHNIQUE: Informed written consent was obtained from the patient after a thorough discussion of the procedural risks, benefits and alternatives. All questions were addressed. Maximal Sterile Barrier Technique was utilized including caps, mask, sterile gowns, sterile gloves, sterile drape, hand hygiene and skin antiseptic. A timeout was performed prior to the initiation of the procedure. In the prone position, the left popliteal fossa was prepped and draped in a sterile fashion. 1% lidocaine was utilized for local anesthesia. Under sonographic guidance, a micropuncture needle was inserted into the popliteal vein and removed over a 018  wire which was up sized to a Bentson. An 8 French sheath was inserted. Contrast was injected for venography. A vertebral catheter was then advanced over the Bentson wire and stepwise venography was  performed to the IVC. Occlusive thrombus was noted in the upper femoral vein, common femoral vein, and lower left external iliac vein. Angiojet was utilized for thrombectomy of the DVT. Repeat venography demonstrates some residual thrombus in the common femoral vein The common femoral vein was then dilated to 12 mm within angioplasty device and final imaging was obtained. The sheath was removed and hemostasis was achieved with direct pressure and a hemostatic pad. FINDINGS: Initial venography demonstrates occlusive thrombus in the upper femoral vein, left common femoral vein, and lower left external iliac vein. The popliteal vein was patent. The mid and lower femoral vein were patent. The left common iliac faint and IVC were widely patent. After thrombectomy, there was marked improvement in the thrombus burden with some residual thrombus in the common femoral vein After angioplasty of the common femoral vein to 12 mm, final imaging demonstrates marked improvement with brisk antegrade flow and some residual narrowing in the left common femoral vein. IMPRESSION: Successful thrombectomy of DVT within the left external iliac vein, common femoral vein, and upper femoral vein. Electronically Signed   By: Marybelle Killings M.D.   On: 10/20/2017 09:52   Ir Thrombect Veno Mech Mod Sed  Result Date: 10/20/2017 INDICATION: Left lower extremity Phlegmasia EXAM: LEFT LOWER EXTREMITY VENOUS THROMBECTOMY COMPARISON:  None. MEDICATIONS: None. 120 cc Isovue-300. ANESTHESIA/SEDATION: Versed 2 mg IV; Fentanyl 50 mcg IV Moderate Sedation Time:  46 minutes The patient was continuously monitored during the procedure by the interventional radiology nurse under my direct supervision. FLUOROSCOPY TIME:  Fluoroscopy Time: 11 minutes 30 seconds seconds (157 mGy). COMPLICATIONS: None immediate. TECHNIQUE: Informed written consent was obtained from the patient after a thorough discussion of the procedural risks, benefits and alternatives. All  questions were addressed. Maximal Sterile Barrier Technique was utilized including caps, mask, sterile gowns, sterile gloves, sterile drape, hand hygiene and skin antiseptic. A timeout was performed prior to the initiation of the procedure. In the prone position, the left popliteal fossa was prepped and draped in a sterile fashion. 1% lidocaine was utilized for local anesthesia. Under sonographic guidance, a micropuncture needle was inserted into the popliteal vein and removed over a 018 wire which was up sized to a Bentson. An 8 French sheath was inserted. Contrast was injected for venography. A vertebral catheter was then advanced over the Bentson wire and stepwise venography was performed to the IVC. Occlusive thrombus was noted in the upper femoral vein, common femoral vein, and lower left external iliac vein. Angiojet was utilized for thrombectomy of the DVT. Repeat venography demonstrates some residual thrombus in the common femoral vein The common femoral vein was then dilated to 12 mm within angioplasty device and final imaging was obtained. The sheath was removed and hemostasis was achieved with direct pressure and a hemostatic pad. FINDINGS: Initial venography demonstrates occlusive thrombus in the upper femoral vein, left common femoral vein, and lower left external iliac vein. The popliteal vein was patent. The mid and lower femoral vein were patent. The left common iliac faint and IVC were widely patent. After thrombectomy, there was marked improvement in the thrombus burden with some residual thrombus in the common femoral vein After angioplasty of the common femoral vein to 12 mm, final imaging demonstrates marked improvement with brisk antegrade flow and some residual narrowing in  the left common femoral vein. IMPRESSION: Successful thrombectomy of DVT within the left external iliac vein, common femoral vein, and upper femoral vein. Electronically Signed   By: Marybelle Killings M.D.   On: 10/20/2017 09:52    Ir US Guide Vasc Access Left  Result Date: 10/20/2017 INDICATION: Left lower extremity Phlegmasia EXAM: LEFT LOWER EXTREMITY VENOUS THROMBECTOMY COMPARISON:  None. MEDICATIONS: None. 120 cc Isovue-300. ANESTHESIA/SEDATION: Versed 2 mg IV; Fentanyl 50 mcg IV Moderate Sedation Time:  46 minutes The patient was continuously monitored during the procedure by the interventional radiology nurse under my direct supervision. FLUOROSCOPY TIME:  Fluoroscopy Time: 11 minutes 30 seconds seconds (157 mGy). COMPLICATIONS: None immediate. TECHNIQUE: Informed written consent was obtained from the patient after a thorough discussion of the procedural risks, benefits and alternatives. All questions were addressed. Maximal Sterile Barrier Technique was utilized including caps, mask, sterile gowns, sterile gloves, sterile drape, hand hygiene and skin antiseptic. A timeout was performed prior to the initiation of the procedure. In the prone position, the left popliteal fossa was prepped and draped in a sterile fashion. 1% lidocaine was utilized for local anesthesia. Under sonographic guidance, a micropuncture needle was inserted into the popliteal vein and removed over a 018 wire which was up sized to a Bentson. An 8 French sheath was inserted. Contrast was injected for venography. A vertebral catheter was then advanced over the Bentson wire and stepwise venography was performed to the IVC. Occlusive thrombus was noted in the upper femoral vein, common femoral vein, and lower left external iliac vein. Angiojet was utilized for thrombectomy of the DVT. Repeat venography demonstrates some residual thrombus in the common femoral vein The common femoral vein was then dilated to 12 mm within angioplasty device and final imaging was obtained. The sheath was removed and hemostasis was achieved with direct pressure and a hemostatic pad. FINDINGS: Initial venography demonstrates occlusive thrombus in the upper femoral vein, left common  femoral vein, and lower left external iliac vein. The popliteal vein was patent. The mid and lower femoral vein were patent. The left common iliac faint and IVC were widely patent. After thrombectomy, there was marked improvement in the thrombus burden with some residual thrombus in the common femoral vein After angioplasty of the common femoral vein to 12 mm, final imaging demonstrates marked improvement with brisk antegrade flow and some residual narrowing in the left common femoral vein. IMPRESSION: Successful thrombectomy of DVT within the left external iliac vein, common femoral vein, and upper femoral vein. Electronically Signed   By: Marybelle Killings M.D.   On: 10/20/2017 09:52    Labs:  CBC: Recent Labs    01/01/17 1546 10/18/17 1305 10/19/17 1837 10/20/17 0818  WBC 4.0 9.5 10.0 8.9  HGB 13.4 13.6 12.9 11.9*  HCT 39.7 41.3 39.2 36.2  PLT 166 135* 122* 105*    COAGS: No results for input(s): INR, APTT in the last 8760 hours.  BMP: Recent Labs    01/01/17 1546 10/18/17 1305 10/19/17 1837  NA 139 138 138  K 3.6 4.9 3.9  CL 110 107 109  CO2 24 24 24   GLUCOSE 103* 92 101*  BUN 17 16 11   CALCIUM 8.4* 8.1* 8.3*  CREATININE 1.19* 0.93 0.96  GFRNONAA 54* >60 >60  GFRAA >60 >60 >60    LIVER FUNCTION TESTS: Recent Labs    10/19/17 1837  BILITOT 0.4  AST 29  ALT 13*  ALKPHOS 71  PROT 6.1*  ALBUMIN 2.8*    Assessment and  Plan: 1. LLE DVT, s/p DVT lysis  Patient is doing very well from a DVT standpoint.  Her foot was noted to be dusky-blue last night.  It is pink and warm this morning.  She denies any pain upon evaluation, but has not ambulated yet. Further care per primary service, but patient is doing well from an intervention standpoint.  Electronically Signed: Henreitta Cea 10/20/2017, 11:43 AM   I spent a total of 15 Minutes at the the patient's bedside AND on the patient's hospital floor or unit, greater than 50% of which was counseling/coordinating care for  LLE DVT

## 2017-10-20 NOTE — Progress Notes (Signed)
[place] for heparin Indication: pulmonary embolus and DVT  Allergies  Allergen Reactions  . Cephalexin Rash  . Dilantin [Phenytoin] Other (See Comments)    Pt doesnt remember    Patient Measurements: Height: 4\' 11"  (149.9 cm) Weight: 154 lb 5.2 oz (70 kg) IBW/kg (Calculated) : 43.2 Heparin Dosing Weight: 58.8 kg  Vital Signs: Temp: 98.2 F (36.8 C) (12/05 1150) Temp Source: Oral (12/05 1150) BP: 108/78 (12/05 1150) Pulse Rate: 40 (12/05 0720)  Labs: Recent Labs    10/18/17 1305 10/19/17 1837 10/20/17 0423 10/20/17 0818 10/20/17 1033  HGB 13.6 12.9  --  11.9*  --   HCT 41.3 39.2  --  36.2  --   PLT 135* 122*  --  105*  --   HEPARINUNFRC  --   --   --   --  0.12*  CREATININE 0.93 0.96  --   --   --   TROPONINI  --   --  0.24* 0.28*  --     Estimated Creatinine Clearance: 62.3 mL/min (by C-G formula based on SCr of 0.96 mg/dL).   Medical History: Past Medical History:  Diagnosis Date  . Allergic rhinitis   . Bradycardia   . Carotid artery occlusion    bilateral carotid artery bypass for Moya Moya  . Cervical spondylosis   . Down's syndrome   . Dyslipidemia   . Hidradenitis   . Hyperlipidemia   . Hypothyroidism   . MVP (mitral valve prolapse)    resolved on echo 2016  . Obesity (BMI 30-39.9) 06/01/2017  . Orthostatic hypotension     Assessment: 5 yof found to have acute PE with RHS (RV/LV ratio 1.7), LLE DVT now s/p thrombectomy on 12/5. No indication for thrombolytics/IR for PE currently per CCM. Not on anticoagulation PTA.  Initial heparin level subtherapeutic at 0.12. Hg wnl, plt down to 122. No bleed or IV line issues noted per discussion with RN.  Goal of Therapy:  Heparin level 0.3-0.7 units/ml Monitor platelets by anticoagulation protocol: Yes   Plan:  Heparin 1500 unit bolus x 1 Increase heparin gtt to 1250 units/hr Daily heparin level/CBC Monitor for s/sx bleeding F/u plans for long-term  anticoagulation  Elicia Lamp, PharmD, BCPS Clinical Pharmacist Clinical phone for 10/20/2017 until 3:30pm: [phone] If after 3:30pm, please call main pharmacy at: [phone] 10/20/2017 12:01 PM

## 2017-10-20 NOTE — Sedation Documentation (Signed)
8Fr sheath removed from L popliteal vein be Darnelle Maffucci, RTR. Hemostasis achieved by manual pressure X 5 mins. Gauze/tegaderm bandage applied, CDI, surrounding tissue soft.

## 2017-10-20 NOTE — Progress Notes (Signed)
ANTICOAGULATION CONSULT NOTE - Follow Up Consult  Pharmacy Consult for Heparin Indication: DVT/PE  Allergies  Allergen Reactions  . Cephalexin Rash  . Dilantin [Phenytoin] Other (See Comments)    Pt doesnt remember    Patient Measurements: Height: 4\' 11"  (149.9 cm) Weight: 154 lb 5.2 oz (70 kg) IBW/kg (Calculated) : 43.2 Heparin Dosing Weight:  58.8 kg  Vital Signs: Temp: 98.4 F (36.9 C) (12/05 2014) Temp Source: Oral (12/05 2014) BP: 95/62 (12/05 2014) Pulse Rate: 94 (12/05 2014)  Labs: Recent Labs    10/18/17 1305 10/19/17 1837 10/20/17 0423 10/20/17 0818 10/20/17 1033 10/20/17 1935  HGB 13.6 12.9  --  11.9*  --   --   HCT 41.3 39.2  --  36.2  --   --   PLT 135* 122*  --  105*  --   --   HEPARINUNFRC  --   --   --   --  0.12* 0.56  CREATININE 0.93 0.96  --   --   --   --   TROPONINI  --   --  0.24* 0.28*  --   --     Estimated Creatinine Clearance: 62.3 mL/min (by C-G formula based on SCr of 0.96 mg/dL).   Assessment: Anticoag: acute PE with RHS (RV/LV ratio 1.7), LLE DVT s/p thrombectomy 12/5. No indication for thrombolytics/IR for PE currently. Not on AC pta. HL 0.12> 0.56 now in goal range.   Goal of Therapy:  Heparin level 0.3-0.7 units/ml Monitor platelets by anticoagulation protocol: Yes   Plan:  Continue IV heparin 1250 units/hr Daily HL and CBC   Rockey Guarino S. Alford Highland, PharmD, BCPS Clinical Staff Pharmacist Pager [phone]  Eilene Ghazi Stillinger 10/20/2017,8:25 PM

## 2017-10-20 NOTE — H&P (Signed)
History and Physical    Nicole Wilson GTX:[id number] DOB: May 20, 1971 DOA: 10/19/2017  PCP: [name], MD   Patient coming from: Home  Chief Complaint: Near-syncope; LLE swelling, redness, and pain  HPI: Nicole Wilson is a 46 y.o. female with medical history significant for trisomy 21, moyamoya status post bilateral carotid artery bypass, and hypothyroidism, now presenting to the emergency department for evaluation of left lower extremity swelling, redness, and pain.  The patient is also been experiencing near syncope and reportedly had one syncopal episode on 12/3 but was caught on her way down by her mother and recovered quickly. She noted some leg pain while walking yesterday and her sisters became concerned when they saw that the leg was markedly swollen and red. Family reports that the swelling and discoloration progressed over the course of the afternoon. Patient denies chest pain or SOB.   ED Course: Upon arrival to the ED, patient is found to be afebrile, saturating adequately on room air, slightly tachycardic, and with blood pressure 95/76.  EKG features a sinus rhythm with RSR' in V1.  Chemistry panel is notable for hypoalbuminemia and CBC features a thrombocytopenia with platelets 122,000.  Venous Doppler reveals acute occlusive DVT in the left common femoral vein, left femoral vein, left proximal profunda, and left popliteal veins.  CTA chest is positive for acute PE and there is evidence for heart strain.  Also noted on the CTA is focal atelectasis versus infarct at the right base.  Patient was started on heparin infusion and critical care consultation was requested by the ED physician.  Critical care recommends medical admission and indicates that they will continue to follow the patient.  There was concern for phlegmasia involving the left lower extremity and interventional radiology has agreed to evaluate the patient in the emergency department.  She will be admitted to the stepdown  unit for ongoing evaluation and management of acute PE with heart strain and occlusive LLE DVT with concern for phlegmasia.  Review of Systems:  All other systems reviewed and apart from HPI, are negative.  Past Medical History:  Diagnosis Date  . Allergic rhinitis   . Bradycardia   . Carotid artery occlusion    bilateral carotid artery bypass for Moya Moya  . Cervical spondylosis   . Down's syndrome   . Dyslipidemia   . Hidradenitis   . Hyperlipidemia   . Hypothyroidism   . MVP (mitral valve prolapse)    resolved on echo 2016  . Obesity (BMI 30-39.9) 06/01/2017  . Orthostatic hypotension     Past Surgical History:  Procedure Laterality Date  . carotid artery surgery    . MIDDLE EAR SURGERY    . TONSILLECTOMY AND ADENOIDECTOMY       reports that  has never smoked. she has never used smokeless tobacco. She reports that she does not drink alcohol or use drugs.  Allergies  Allergen Reactions  . Cephalexin Rash  . Dilantin [Phenytoin] Other (See Comments)    Pt doesnt remember    Family History  Problem Relation Age of Onset  . Multiple myeloma Father   . Prostate cancer Father   . Heart attack Neg Hx   . Stroke Neg Hx      Prior to Admission medications   Medication Sig Start Date End Date Taking? Authorizing Provider  doxycycline (VIBRAMYCIN) 100 MG capsule Take 100 mg by mouth 2 (two) times daily. 10/14/17  Yes [provider]  ibuprofen (ADVIL,MOTRIN) 200 MG tablet Take  200 mg by mouth every 6 (six) hours as needed for mild pain.    Yes [provider]  levothyroxine (SYNTHROID, LEVOTHROID) 100 MCG tablet Take 50 mcg by mouth daily.  07/19/13  Yes [provider]  Multiple Vitamin (MULTIVITAMIN) tablet Take 1 tablet by mouth daily.   Yes [provider]  tobramycin-dexamethasone [facility]) ophthalmic solution Place 2 drops into both ears 2 (two) times daily. 09/20/17  Yes [provider]    Physical Exam: Vitals:    10/19/17 1824 10/19/17 2121 10/20/17 0154 10/20/17 0200  BP: (!) 101/54 95/76 102/76   Pulse: 91 87 85   Resp: _0 Temp: 98.1 F (36.7 C)     TempSrc: Oral     SpO2: 98% 100% 100%   Weight:    67 kg (147 lb 11.3 oz)  Height:    _1  (1.473 m)      Constitutional: NAD, calm  Eyes: PERTLA, lids and conjunctivae without inflammation or drainage ENMT: Mucous membranes are moist. Posterior pharynx clear of any exudate or lesions.   Neck: normal, supple, no masses, no thyromegaly Respiratory: clear to auscultation bilaterally, no wheezing, no crackles. Normal respiratory effort.   Cardiovascular: S1 & S2 heard, regular rate and rhythm. No significant JVD. Abdomen: No distension, no tenderness, no masses palpated. Bowel sounds normal.  Musculoskeletal: LLE edematous, erythematous, and tender. Cap refill diminished in left foot and calf is taut. No joint deformity upper and lower extremities.  Skin: Aside from LLE findings, no significant rashes, lesions, ulcers. Warm, dry, well-perfused. Neurologic: CN 2-12 grossly intact. Sensation intact. Strength 5/5 in all 4 limbs. Gross hearing deficit. Psychiatric:  Alert and oriented x 3. Very pleasant and cooperative.     Labs on Admission: I have personally reviewed following labs and imaging studies  CBC: Recent Labs  Lab 10/18/17 1305 10/19/17 1837  WBC 9.5 10.0  NEUTROABS  --  8.0*  HGB 13.6 12.9  HCT 41.3 39.2  MCV 95.6 94.7  PLT 135* 017*   Basic Metabolic Panel: Recent Labs  Lab 10/18/17 1305 10/19/17 1837  NA 138 138  K 4.9 3.9  CL 107 109  CO2 24 24  GLUCOSE 92 101*  BUN 16 11  CREATININE 0.93 0.96  CALCIUM 8.1* 8.3*   GFR: Estimated Creatinine Clearance: 59.3 mL/min (by C-G formula based on SCr of 0.96 mg/dL). Liver Function Tests: Recent Labs  Lab 10/19/17 1837  AST 29  ALT 13*  ALKPHOS 71  BILITOT 0.4  PROT 6.1*  ALBUMIN 2.8*   No results for input(s): LIPASE, AMYLASE in the last 168  hours. No results for input(s): AMMONIA in the last 168 hours. Coagulation Profile: No results for input(s): INR, PROTIME in the last 168 hours. Cardiac Enzymes: No results for input(s): CKTOTAL, CKMB, CKMBINDEX, TROPONINI in the last 168 hours. BNP (last 3 results) No results for input(s): PROBNP in the last 8760 hours. HbA1C: No results for input(s): HGBA1C in the last 72 hours. CBG: Recent Labs  Lab 10/18/17 1049  GLUCAP 85   Lipid Profile: No results for input(s): CHOL, HDL, LDLCALC, TRIG, CHOLHDL, LDLDIRECT in the last 72 hours. Thyroid Function Tests: No results for input(s): TSH, T4TOTAL, FREET4, T3FREE, THYROIDAB in the last 72 hours. Anemia Panel: No results for input(s): VITAMINB12, FOLATE, FERRITIN, TIBC, IRON, RETICCTPCT in the last 72 hours. Urine analysis:    Component Value Date/Time   COLORURINE YELLOW 10/18/2017 1228   APPEARANCEUR HAZY (A) 10/18/2017 1228  LABSPEC 1.021 10/18/2017 1228   PHURINE 6.0 10/18/2017 1228   GLUCOSEU NEGATIVE 10/18/2017 1228   HGBUR NEGATIVE 10/18/2017 1228   BILIRUBINUR NEGATIVE 10/18/2017 1228   [place] 10/18/2017 1228   PROTEINUR 30 (A) 10/18/2017 1228   UROBILINOGEN 0.2 10/31/2007 1632   NITRITE NEGATIVE 10/18/2017 1228   LEUKOCYTESUR NEGATIVE 10/18/2017 1228   Sepsis Labs: _0 (procalcitonin:4,lacticidven:4) )No results found for this or any previous visit (from the past 240 hour(s)).   Radiological Exams on Admission: Ct Angio Chest Pe W And/or Wo Contrast  Result Date: 10/20/2017 CLINICAL DATA:  46 year old female with left leg swelling and pain. Concern for pulmonary embolism. EXAM: CT ANGIOGRAPHY CHEST WITH CONTRAST TECHNIQUE: Multidetector CT imaging of the chest was performed using the standard protocol during bolus administration of intravenous contrast. Multiplanar CT image reconstructions and MIPs were obtained to evaluate the vascular anatomy. CONTRAST:  166m ISOVUE-370 IOPAMIDOL (ISOVUE-370)  INJECTION 76% COMPARISON:  None. FINDINGS: Cardiovascular: Bilateral large pulmonary emboli involving the central pulmonary artery is extending into the lobar and segmental branches of the upper and lower lobes bilaterally. There is mild cardiomegaly with dilatation of the right ventricle and right atrium. The right ventricle measures 4.1 cm in diameter and the left ventricle measures 2.4 cm in diameter with a ratio of 1.7. There is a small pericardial effusion measuring 7 mm in thickness. The thoracic aorta is unremarkable. The origins of the great vessels of the aortic arch appear patent. Mediastinum/Nodes: No hilar or mediastinal adenopathy. Esophagus and the thyroid gland are grossly unremarkable as visualized. No mediastinal fluid collection. Lungs/Pleura: Small focal pleural based haziness at the right lung base may represent atelectatic changes or focal area of pulmonary infarct. The lungs are otherwise clear. There is no pleural effusion or pneumothorax. The central airways are patent. Upper Abdomen: No acute abnormality. Musculoskeletal: Degenerative changes of the lower cervical spine. No acute osseous pathology. Review of the MIP images confirms the above findings. IMPRESSION: 1. Positive for acute PE with CTevidence of right heart strain (RV/LV Ratio = 1.7) consistent with at least submassive (intermediate risk) PE. The presence of right heart strain has been associated with an increased risk of morbidity and mortality. 2. Focal area of atelectasis versus pulmonary infarct at the right lung base. These results were called by telephone at the time of interpretation on 10/20/2017 at 3:22 am to [facility], who verbally acknowledged these results. Electronically Signed   By: AAnner CreteM.D.   On: 10/20/2017 03:25    EKG: Independently reviewed. Sinus rhythm, RSR' in V1.   Assessment/Plan  1. Acute PE with heart strain  - Pt presents with LLE swelling and pain, found  to have DVT discussed below, but also had syncope the day prior and was noted to have low BP and elevated HR  - CTA demonstrates acute PE with heart-strain on CT  - PCCM consulting and much appreciated  - Plan to continue heparin infusion, cardiac monitoring, obtain troponin measurements and echocardiogram    2. LLE DVT with phlegmasia  - Pt presents with LLE swelling, pain, discoloration  - She is noted to have poor distal pulse and [place]ve[place] occlusive DVT  - She was started on heparin infusion  - IR is consulting and much appreciated, planning to take pt for intervention    3. Hypothyroidism  - Appears stable  - Continue Synthroid   4. Thrombocytopenia  - Platelets 122k on admission, previously wnl  - Follow daily CBC while on  IV heparin     DVT prophylaxis: IV heparin infusion Code Status: Full  Family Communication: Mother and father updated at bedside Disposition Plan: Admit to SDU Consults called: PCCM, IR  Admission status: Inpatient    Vianne Bulls, MD [facility] Pager [phone]  If 7PM-7AM, please contact night-coverage [email or web address] Password [email or web address]  10/20/2017, 4:25 AM

## 2017-10-20 NOTE — ED Provider Notes (Signed)
[facility] EMERGENCY DEPARTMENT Provider Note   CSN: [id number] Arrival date & time: 10/19/17  1800     History   Chief Complaint Chief Complaint  Patient presents with  . Leg Swelling  . Leg Pain    HPI Nicole Wilson is a 46 y.o. female with a hx of bradycardia, Down syndrome, hyperlipidemia, static hypotension presents to the Emergency Department complaining of gradual, persistent, progressively worsening swelling of her left leg onset this morning.  Patient reports she has developed associated pain in the leg.  No aggravating or alleviating factors.  She reports that the swelling has increased throughout the day.  Patient denies headache, neck pain, chest pain, shortness of breath, abdominal pain, nausea, vomiting, diarrhea, weakness, dizziness.  Patient did have syncopal episode on 10/18/2017.  She was seen in the emergency department at that time and it was felt to be dehydration.  The history is provided by the patient, medical records and a parent. No language interpreter was used.    Past Medical History:  Diagnosis Date  . Allergic rhinitis   . Bradycardia   . Carotid artery occlusion    bilateral carotid artery bypass for Moya Moya  . Cervical spondylosis   . Down's syndrome   . Dyslipidemia   . Hidradenitis   . Hyperlipidemia   . Hypothyroidism   . MVP (mitral valve prolapse)    resolved on echo 2016  . Obesity (BMI 30-39.9) 06/01/2017  . Orthostatic hypotension     Patient Active Problem List   Diagnosis Date Noted  . Left leg DVT ([place]) 10/20/2017  . Pulmonary embolism with acute cor pulmonale ([place]) 10/20/2017  . 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 Obesity (BMI 30-39.9) 06/01/2017  . HLD (hyperlipidemia) 11/25/2015  . Spondylolisthesis of lumbosacral region 11/25/2015  . Cervical stenosis of spinal canal 03/16/2014  . Chronic chest wall pain 10/06/2013  . Bradycardia   . Carotid artery occlusion   .  MVP (mitral valve prolapse)   . Hypothyroidism   . Orthostatic hypotension   . Down syndrome   . Hidradenitis   . Dyslipidemia   . Allergic rhinitis   . Moyamoya disease 03/03/2012    Past Surgical History:  Procedure Laterality Date  . carotid artery surgery    . MIDDLE EAR SURGERY    . TONSILLECTOMY AND ADENOIDECTOMY      OB History    No data available       Home Medications    Prior to Admission medications   Medication Sig Start Date End Date Taking? Authorizing Provider  doxycycline (VIBRAMYCIN) 100 MG capsule Take 100 mg by mouth 2 (two) times daily. 10/14/17  Yes [provider]  ibuprofen (ADVIL,MOTRIN) 200 MG tablet Take 200 mg by mouth every 6 (six) hours as needed for mild pain.    Yes [provider]  levothyroxine (SYNTHROID, LEVOTHROID) 100 MCG tablet Take 50 mcg by mouth daily.  07/19/13  Yes [provider]  Multiple Vitamin (MULTIVITAMIN) tablet Take 1 tablet by mouth daily.   Yes [provider]  tobramycin-dexamethasone Baird Cancer) ophthalmic solution Place 2 drops into both ears 2 (two) times daily. 09/20/17  Yes [provider]    Family History Family History  Problem Relation Age of Onset  . Multiple myeloma Father   . Prostate cancer Father   . Heart attack Neg Hx   . Stroke Neg Hx     Social History Social History  Tobacco Use  . Smoking status: Never Smoker  . Smokeless tobacco: Never Used  Substance Use Topics  . Alcohol use: No  . Drug use: No     Allergies   Cephalexin and Dilantin [phenytoin]   Review of Systems Review of Systems  Constitutional: Negative for appetite change, diaphoresis, fatigue, fever and unexpected weight change.  HENT: Negative for mouth sores.   Eyes: Negative for visual disturbance.  Respiratory: Negative for cough, chest tightness, shortness of breath and wheezing.   Cardiovascular: Positive for leg swelling ( left). Negative for chest pain.    Gastrointestinal: Negative for abdominal pain, constipation, diarrhea, nausea and vomiting.  Endocrine: Negative for polydipsia, polyphagia and polyuria.  Genitourinary: Negative for dysuria, frequency, hematuria and urgency.  Musculoskeletal: Positive for back pain ( low, chronic). Negative for neck stiffness.  Skin: Negative for rash.  Allergic/Immunologic: Negative for immunocompromised state.  Neurological: Negative for syncope, light-headedness and headaches.  Hematological: Does not bruise/bleed easily.  Psychiatric/Behavioral: Negative for sleep disturbance. The patient is not nervous/anxious.      Physical Exam Updated Vital Signs BP 95/76   Pulse 87   Temp 98.1 F (36.7 C) (Oral)   Resp 16   SpO2 100%   Physical Exam  Constitutional: She appears well-developed and well-nourished. No distress.  Awake, alert, nontoxic appearance  HENT:  Head: Normocephalic and atraumatic.  Mouth/Throat: Oropharynx is clear and moist. No oropharyngeal exudate.  Eyes: Conjunctivae are normal. No scleral icterus.  Neck: Normal range of motion. Neck supple.  Cardiovascular: Normal rate, regular rhythm and intact distal pulses.  Pulmonary/Chest: Effort normal and breath sounds normal. No respiratory distress. She has no wheezes.  Equal chest expansion Clear and equal breath sounds  Abdominal: Soft. Bowel sounds are normal. She exhibits no mass. There is no tenderness. There is no rebound and no guarding.  Musculoskeletal: She exhibits edema ( left leg).  Significant edema of the left lower extremity from mid forefoot to proximal thigh without significant tenderness to palpation.  Discoloration of the leg including ecchymosis of the toes and foot is cool to touch. Capillary refill equals 5 seconds in the LLE.  Dorsalis pedis pulses are palpable in the bilateral lower extremities.  Slightly decreased range of motion of the left lower extremity due to significant swelling.  Full range of motion of  the right lower extremity.  Neurological: She is alert.  Speech is clear and goal oriented Moves extremities without ataxia  Skin: Skin is warm and dry. She is not diaphoretic.  Psychiatric: She has a normal mood and affect.  Nursing note and vitals reviewed.          ED Treatments / Results  Labs (all labs ordered are listed, but only abnormal results are displayed) Labs Reviewed  COMPREHENSIVE METABOLIC PANEL - Abnormal; Notable for the following components:      Result Value   Glucose, Bld 101 (*)    Calcium 8.3 (*)    Total Protein 6.1 (*)    Albumin 2.8 (*)    ALT 13 (*)    All other components within normal limits  CBC WITH DIFFERENTIAL/PLATELET - Abnormal; Notable for the following components:   Platelets 122 (*)    Neutro Abs 8.0 (*)    All other components within normal limits  HEPARIN LEVEL (UNFRACTIONATED)  CBC  BRAIN NATRIURETIC PEPTIDE  HIV ANTIBODY (ROUTINE TESTING)  TROPONIN I  TROPONIN I  TROPONIN I  I-STAT CG4 LACTIC ACID, ED  I-STAT CG4 LACTIC ACID, ED  I-STAT TROPONIN, ED  TYPE AND SCREEN    EKG  EKG Interpretation  Date/Time:  Wednesday October 20 2017 02:22:12 EST Ventricular Rate:  80 PR Interval:    QRS Duration: 95 QT Interval:  408 QTC Calculation: 471 R Axis:   86 Text Interpretation:  Sinus rhythm RSR' in V1 or V2, right VCD or RVH Abnormal T, consider ischemia, anterior leads Baseline wander in lead(s) V2 V3 Confirmed by Orpah Greek [phone]) on 10/20/2017 3:24:20 AM       Radiology Ct Angio Chest Pe W And/or Wo Contrast  Result Date: 10/20/2017 CLINICAL DATA:  46 year old female with left leg swelling and pain. Concern for pulmonary embolism. EXAM: CT ANGIOGRAPHY CHEST WITH CONTRAST TECHNIQUE: Multidetector CT imaging of the chest was performed using the standard protocol during bolus administration of intravenous contrast. Multiplanar CT image reconstructions and MIPs were obtained to evaluate the vascular anatomy.  CONTRAST:  134m ISOVUE-370 IOPAMIDOL (ISOVUE-370) INJECTION 76% COMPARISON:  None. FINDINGS: Cardiovascular: Bilateral large pulmonary emboli involving the central pulmonary artery is extending into the lobar and segmental branches of the upper and lower lobes bilaterally. There is mild cardiomegaly with dilatation of the right ventricle and right atrium. The right ventricle measures 4.1 cm in diameter and the left ventricle measures 2.4 cm in diameter with a ratio of 1.7. There is a small pericardial effusion measuring 7 mm in thickness. The thoracic aorta is unremarkable. The origins of the great vessels of the aortic arch appear patent. Mediastinum/Nodes: No hilar or mediastinal adenopathy. Esophagus and the thyroid gland are grossly unremarkable as visualized. No mediastinal fluid collection. Lungs/Pleura: Small focal pleural based haziness at the right lung base may represent atelectatic changes or focal area of pulmonary infarct. The lungs are otherwise clear. There is no pleural effusion or pneumothorax. The central airways are patent. Upper Abdomen: No acute abnormality. Musculoskeletal: Degenerative changes of the lower cervical spine. No acute osseous pathology. Review of the MIP images confirms the above findings. IMPRESSION: 1. Positive for acute PE with CTevidence of right heart strain (RV/LV Ratio = 1.7) consistent with at least submassive (intermediate risk) PE. The presence of right heart strain has been associated with an increased risk of morbidity and mortality. 2. Focal area of atelectasis versus pulmonary infarct at the right lung base. These results were called by telephone at the time of interpretation on 10/20/2017 at 3:22 am to [facility], who verbally acknowledged these results. Electronically Signed   By: AAnner CreteM.D.   On: 10/20/2017 03:25    Procedures Procedures (including critical care time)  CRITICAL CARE Performed by: HJarrett Soho Andreus Cure Total critical care time: 45 minutes Critical care time was exclusive of separately billable procedures and treating other patients. Critical care was necessary to treat or prevent imminent or life-threatening deterioration. Critical care was time spent personally by me on the following activities: development of treatment plan with patient and/or surrogate as well as nursing, discussions with consultants, evaluation of patient's response to treatment, examination of patient, obtaining history from patient or surrogate, ordering and performing treatments and interventions, ordering and review of laboratory studies, ordering and review of radiographic studies, pulse oximetry and re-evaluation of patient's condition.   Medications Ordered in ED Medications  heparin ADULT infusion 100 units/mL (25000 units/2568msodium chloride 0.45%) (1,000 Units/hr Intravenous New Bag/Given 10/20/17 0259)  doxycycline (VIBRAMYCIN) capsule 100 mg (not administered)  levothyroxine (SYNTHROID, LEVOTHROID) tablet 50 mcg (not administered)  multivitamin tablet 1 tablet (not administered)  tobramycin-dexamethasone (TOBRADEX) ophthalmic suspension 2 drop (not administered)  sodium chloride flush (NS) 0.9 % injection 3 mL (not administered)  0.9 %  sodium chloride infusion (not administered)  acetaminophen (TYLENOL) tablet 650 mg (not administered)    Or  acetaminophen (TYLENOL) suppository 650 mg (not administered)  HYDROcodone-acetaminophen (NORCO/VICODIN) 5-325 MG per tablet 1-2 tablet (not administered)  senna-docusate (Senokot-S) tablet 1 tablet (not administered)  bisacodyl (DULCOLAX) EC tablet 5 mg (not administered)  ondansetron (ZOFRAN) tablet 4 mg (not administered)    Or  ondansetron (ZOFRAN) injection 4 mg (not administered)  lidocaine (XYLOCAINE) 1 % (with pres) injection (not administered)  iopamidol (ISOVUE-300) 61 % injection (not administered)  iopamidol (ISOVUE-370) 76 % injection (100  mLs  Contrast Given 10/20/17 0115)  heparin bolus via infusion 3,000 Units (3,000 Units Intravenous Bolus from Bag 10/20/17 0300)     Initial Impression / Assessment and Plan / ED Course  I have reviewed the triage vital signs and the nursing notes.  Pertinent labs & imaging results that were available during my care of the patient were reviewed by me and considered in my medical decision making (see chart for details).  Clinical Course as of Oct 21 435  Wed Oct 20, 2017  0204 Discussed with Dr. Bridgett Larsson who reports only IR will be able to perform an intervention at this time.   [HM]  0300 Discussed with Dr. Barbie Banner who will evaluate in the ED  [HM]  0354 Discussed with Dr. Ashok Cordia of PCCM who recommends stepdown bed admitted by [facility] and PCCM will follow along  [HM]  0425 Discussed with Dr. Myna Hidalgo who will admit.   [HM]    Clinical Course User Index [HM] Kamillah Didonato, Jarrett Soho, [place]    Patient seen in triage and venous duplex ordered.  This is positive for acute occlusive DVT coursing from the popliteal vein through the common femoral vein however they were unable to image the external iliac.  On exam, patient with ecchymosis of the toes and foot.  Concern for phlegmasia cerulea dolens.  Will initiate heparin and consult Vascular.  Patient will have CT scan of her chest to rule out possible PE with previous syncope and large DVT.   T scan shows submassive, bilateral PE with right heart strain.  Patient has been evaluated by interventional radiology who will take her for intervention.  PCCM will follow.  Pt admitted to [facility].  Heparin is running.  .  The patient was discussed with and seen by Dr. Betsey Holiday who agrees with the treatment plan.   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

## 2017-10-20 NOTE — ED Provider Notes (Signed)
Patient presented to the ER with pain and swelling of left leg.  Face to face Exam: HEENT - PERRLA Lungs - CTAB Heart - RRR, no M/R/G Abd - S/NT/ND Neuro - alert, oriented x3 Musculoskeletal -diffuse swelling of entire leg from hip to toes, foot dusky and cool to touch  Plan: DVT study is positive.  Patient has decreased capillary refill, purplish duskiness of the foot with discoloration of the leg to the knee, consistent with phlegmasia cerulea dolens.  Discussion with Dr. Vallarie Mare, vascular surgery, informed that there is no team for intervention, recommended interventional radiology.  Interventional radiology consulted to evaluate patient.  Patient will have CT angiography to rule out PE, secondary to low blood pressure and tachycardia.   Orpah Greek, MD 10/20/17 [phone]

## 2017-10-20 NOTE — Procedures (Signed)
LLE DVT thrombectomy  Find - L CFV and FV occlusive DVT  Int - Angiojet 200 sec. PTA venous 12 mm   EBL 0  Comp 0

## 2017-10-20 NOTE — Progress Notes (Signed)
ANTICOAGULATION CONSULT NOTE - Initial Consult  Pharmacy Consult for heparin Indication: DVT +/- PE  Allergies  Allergen Reactions  . Cephalexin Rash  . Dilantin [Phenytoin] Other (See Comments)    Pt doesnt remember    Patient Measurements: Height: 4\' 10"  (147.3 cm) Weight: 147 lb 11.3 oz (67 kg) IBW/kg (Calculated) : 40.9 Heparin Dosing Weight: 55kg  Vital Signs: Temp: 98.1 F (36.7 C) (12/04 1824) Temp Source: Oral (12/04 1824) BP: 102/76 (12/05 0154) Pulse Rate: 85 (12/05 0154)  Labs: Recent Labs    10/18/17 1305 10/19/17 1837  HGB 13.6 12.9  HCT 41.3 39.2  PLT 135* 122*  CREATININE 0.93 0.96    Estimated Creatinine Clearance: 59.3 mL/min (by C-G formula based on SCr of 0.96 mg/dL).   Medical History: Past Medical History:  Diagnosis Date  . Allergic rhinitis   . Bradycardia   . Carotid artery occlusion    bilateral carotid artery bypass for Moya Moya  . Cervical spondylosis   . Down's syndrome   . Dyslipidemia   . Hidradenitis   . Hyperlipidemia   . Hypothyroidism   . MVP (mitral valve prolapse)    resolved on echo 2016  . Obesity (BMI 30-39.9) 06/01/2017  . Orthostatic hypotension     Assessment: 46yo female c/o persistent and gradually worsening leg pain/swelling, duplex confirms DVT, awaiting CT of chest, to begin heparin.  Goal of Therapy:  Heparin level 0.3-0.7 units/ml Monitor platelets by anticoagulation protocol: Yes   Plan:  Will give heparin 3000 units IV bolus x1 followed by gtt at 1000 units/hr and monitor heparin levels and CBC.  Wynona Neat, PharmD, BCPS  10/20/2017,2:18 AM

## 2017-10-20 NOTE — ED Notes (Signed)
Patient transported to CT 

## 2017-10-20 NOTE — Consult Note (Signed)
Chief Complaint: Patient was seen in consultation today for  Chief Complaint  Patient presents with  . Leg Swelling  . Leg Pain   at the request of Smith,David  Referring Physician(s): Smith,David  Supervising Physician: Marybelle Killings  Patient Status: [facility] - ED  History of Present Illness: Nicole Wilson is a 46 y.o. female who presented with syncope yesterday and increasing LLE edema. She is asymptomatic presently other than left leg swelling. She has a history of Moya Moya and Downs Syndrome. She and family deny recent bleeding, stroke, surgery. She was found to have submassive PE (RV/LV 1.7) and extensive LLE DVT.  Past Medical History:  Diagnosis Date  . Allergic rhinitis   . Bradycardia   . Carotid artery occlusion    bilateral carotid artery bypass for Moya Moya  . Cervical spondylosis   . Down's syndrome   . Dyslipidemia   . Hidradenitis   . Hyperlipidemia   . Hypothyroidism   . MVP (mitral valve prolapse)    resolved on echo 2016  . Obesity (BMI 30-39.9) 06/01/2017  . Orthostatic hypotension     Past Surgical History:  Procedure Laterality Date  . carotid artery surgery    . MIDDLE EAR SURGERY    . TONSILLECTOMY AND ADENOIDECTOMY      Allergies: Cephalexin and Dilantin [phenytoin]  Medications: Prior to Admission medications   Medication Sig Start Date End Date Taking? Authorizing Provider  doxycycline (VIBRAMYCIN) 100 MG capsule Take 100 mg by mouth 2 (two) times daily. 10/14/17  Yes [provider]  ibuprofen (ADVIL,MOTRIN) 200 MG tablet Take 200 mg by mouth every 6 (six) hours as needed for mild pain.    Yes [provider]  levothyroxine (SYNTHROID, LEVOTHROID) 100 MCG tablet Take 50 mcg by mouth daily.  07/19/13  Yes [provider]  Multiple Vitamin (MULTIVITAMIN) tablet Take 1 tablet by mouth daily.   Yes [provider]  tobramycin-dexamethasone Baird Cancer) ophthalmic solution Place 2 drops into both ears 2 (two)  times daily. 09/20/17  Yes [provider]     Family History  Problem Relation Age of Onset  . Multiple myeloma Father   . Prostate cancer Father   . Heart attack Neg Hx   . Stroke Neg Hx     Social History   Socioeconomic History  . Marital status: Single    Spouse name: None  . Number of children: None  . Years of education: None  . Highest education level: None  Social Needs  . Financial resource strain: None  . Food insecurity - worry: None  . Food insecurity - inability: None  . Transportation needs - medical: None  . Transportation needs - non-medical: None  Occupational History  . None  Tobacco Use  . Smoking status: Never Smoker  . Smokeless tobacco: Never Used  Substance and Sexual Activity  . Alcohol use: No  . Drug use: No  . Sexual activity: None  Other Topics Concern  . None  Social History Narrative  . None     Review of Systems: A 12 point ROS discussed and pertinent positives are indicated in the HPI above.  All other systems are negative.  Review of Systems  Vital Signs: BP 102/76   Pulse 85   Temp 98.1 F (36.7 C) (Oral)   Resp 17   Ht '4\' 10"'  (1.473 m)   Wt 147 lb 11.3 oz (67 kg)   SpO2 100%   BMI 30.87 kg/m   Physical  Exam  Constitutional: She is oriented to person, place, and time. She appears well-developed and well-nourished.  HENT:  Head: Normocephalic and atraumatic.  Pulmonary/Chest: Effort normal.  Musculoskeletal:  LLE - marked edema from thigh to toes. DP/PT pulses are palpable. Distal foot and toes are blue.  Neurological: She is alert and oriented to person, place, and time.    Imaging: Ct Angio Chest Pe W And/or Wo Contrast  Result Date: 10/20/2017 CLINICAL DATA:  46 year old female with left leg swelling and pain. Concern for pulmonary embolism. EXAM: CT ANGIOGRAPHY CHEST WITH CONTRAST TECHNIQUE: Multidetector CT imaging of the chest was performed using the standard protocol during bolus administration of  intravenous contrast. Multiplanar CT image reconstructions and MIPs were obtained to evaluate the vascular anatomy. CONTRAST:  115m ISOVUE-370 IOPAMIDOL (ISOVUE-370) INJECTION 76% COMPARISON:  None. FINDINGS: Cardiovascular: Bilateral large pulmonary emboli involving the central pulmonary artery is extending into the lobar and segmental branches of the upper and lower lobes bilaterally. There is mild cardiomegaly with dilatation of the right ventricle and right atrium. The right ventricle measures 4.1 cm in diameter and the left ventricle measures 2.4 cm in diameter with a ratio of 1.7. There is a small pericardial effusion measuring 7 mm in thickness. The thoracic aorta is unremarkable. The origins of the great vessels of the aortic arch appear patent. Mediastinum/Nodes: No hilar or mediastinal adenopathy. Esophagus and the thyroid gland are grossly unremarkable as visualized. No mediastinal fluid collection. Lungs/Pleura: Small focal pleural based haziness at the right lung base may represent atelectatic changes or focal area of pulmonary infarct. The lungs are otherwise clear. There is no pleural effusion or pneumothorax. The central airways are patent. Upper Abdomen: No acute abnormality. Musculoskeletal: Degenerative changes of the lower cervical spine. No acute osseous pathology. Review of the MIP images confirms the above findings. IMPRESSION: 1. Positive for acute PE with CTevidence of right heart strain (RV/LV Ratio = 1.7) consistent with at least submassive (intermediate risk) PE. The presence of right heart strain has been associated with an increased risk of morbidity and mortality. 2. Focal area of atelectasis versus pulmonary infarct at the right lung base. These results were called by telephone at the time of interpretation on 10/20/2017 at 3:22 am to physician assistant [facility], who verbally acknowledged these results. Electronically Signed   By: AAnner CreteM.D.   On: 10/20/2017  03:25    Labs:  CBC: Recent Labs    01/01/17 1546 10/18/17 1305 10/19/17 1837  WBC 4.0 9.5 10.0  HGB 13.4 13.6 12.9  HCT 39.7 41.3 39.2  PLT 166 135* 122*    COAGS: No results for input(s): INR, APTT in the last 8760 hours.  BMP: Recent Labs    01/01/17 1546 10/18/17 1305 10/19/17 1837  NA 139 138 138  K 3.6 4.9 3.9  CL 110 107 109  CO2 '24 24 24  ' GLUCOSE 103* 92 101*  BUN '17 16 11  ' CALCIUM 8.4* 8.1* 8.3*  CREATININE 1.19* 0.93 0.96  GFRNONAA 54* >60 >60  GFRAA >60 >60 >60    LIVER FUNCTION TESTS: Recent Labs    10/19/17 1837  BILITOT 0.4  AST 29  ALT 13*  ALKPHOS 71  PROT 6.1*  ALBUMIN 2.8*    TUMOR MARKERS: No results for input(s): AFPTM, CEA, CA199, CHROMGRNA in the last 8760 hours.  Assessment and Plan:  The findings are worrisome for Phlegmasia Cerulea Dolens. Catheter directed therapy is recommended. She is relatively asymptomatic from a pulmonary embolism  standpoint, therefore I would not recommend pulmonary artery lysis at this time, unless she decompensates.  Thank you for this interesting consult.  I greatly enjoyed meeting Sydell Axon and look forward to participating in their care.  A copy of this report was sent to the requesting provider on this date.  Electronically Signed: Tuere Nwosu, ART A, MD 10/20/2017, 4:27 AM   I spent a total of 55 Mi[place]utes  in face to face in clinical consultation, greater than 50% of which was counseling/coordinating care for DVT.

## 2017-10-20 NOTE — ED Notes (Signed)
Pt transported to IR 

## 2017-10-21 ENCOUNTER — Inpatient Hospital Stay (HOSPITAL_COMMUNITY): Payer: Medicare Other

## 2017-10-21 DIAGNOSIS — I361 Nonrheumatic tricuspid (valve) insufficiency: Secondary | ICD-10-CM

## 2017-10-21 LAB — ECHOCARDIOGRAM COMPLETE
HEIGHTINCHES: 59 in
WEIGHTICAEL: 2539.7 [oz_av]

## 2017-10-21 LAB — CBC
HCT: 35 % — ABNORMAL LOW (ref 36.0–46.0)
Hemoglobin: 11.5 g/dL — ABNORMAL LOW (ref 12.0–15.0)
MCH: 31.1 pg (ref 26.0–34.0)
MCHC: 32.9 g/dL (ref 30.0–36.0)
MCV: 94.6 fL (ref 78.0–100.0)
PLATELETS: 115 10*3/uL — AB (ref 150–400)
RBC: 3.7 MIL/uL — ABNORMAL LOW (ref 3.87–5.11)
RDW: 15 % (ref 11.5–15.5)
WBC: 7.5 10*3/uL (ref 4.0–10.5)

## 2017-10-21 LAB — HIV ANTIBODY (ROUTINE TESTING W REFLEX): HIV SCREEN 4TH GENERATION: NONREACTIVE

## 2017-10-21 LAB — SEDIMENTATION RATE: SED RATE: 13 mm/h (ref 0–22)

## 2017-10-21 LAB — HEPARIN LEVEL (UNFRACTIONATED): HEPARIN UNFRACTIONATED: 0.57 [IU]/mL (ref 0.30–0.70)

## 2017-10-21 MED ORDER — TRAZODONE HCL 50 MG PO TABS
50.0000 mg | ORAL_TABLET | Freq: Once | ORAL | Status: AC
  Administered 2017-10-21: 50 mg via ORAL
  Filled 2017-10-21: qty 1

## 2017-10-21 NOTE — Progress Notes (Signed)
PROGRESS NOTE    Nicole Wilson  IRS:[id number] DOB: 12/01/1970 DOA: 10/19/2017 PCP: [name], MD    Brief Narrative:   46 y.o. female with medical history significant for trisomy 21, moyamoya status post bilateral carotid artery bypass, and hypothyroidism, now presenting to the emergency department for evaluation of left lower extremity swelling, redness, and pain.  The patient is also been experiencing near syncope and reportedly had one syncopal episode on 12/3 but was caught on her way down by her mother and recovered quickly. She noted some leg pain while walking yesterday and her sisters became concerned when they saw that the leg was markedly swollen and red.  Assessment & Plan:   Principal Problem:   Pulmonary embolism with acute cor pulmonale (HCC) - No procedures planned by specialist - Plan is to continue heparin at this point - consult care manager to see which NOAC is least expensive - Etiology uncertain  Active Problems:   Hypothyroidism - Stable on synthroid. Will continue    Down syndrome   Left leg DVT (HCC) - Pt had thrombectomy, no further procedures planned by specialist. - continue anticoagulation with heparin   DVT prophylaxis: Heparin gtt Code Status: Full Family Communication: d/c family at bedside Disposition Plan: pending improvement in condition   Consultants:   Pulmonology: Dr. Titus Mould  Radiology: Dr. Barbie Banner   Procedures: none   Antimicrobials: none   Subjective: Pt has no new complaints  Objective: Vitals:   10/20/17 2320 10/21/17 0015 10/21/17 0427 10/21/17 0753  BP: (!) 87/55 (!) 92/59 91/60 106/71  Pulse: 71 81 61 68  Resp: 15 17 15 18   Temp: 98.3 F (36.8 C)  98 F (36.7 C) 97.6 F (36.4 C)  TempSrc: Oral  Oral Oral  SpO2: 95% 93% 99% 100%  Weight:   72 kg (158 lb 11.7 oz)   Height:        Intake/Output Summary (Last 24 hours) at 10/21/2017 1038 Last data filed at 10/21/2017 0400 Gross per 24 hour  Intake 479.59  ml  Output 4 ml  Net 475.59 ml   Filed Weights   10/20/17 0200 10/20/17 0612 10/21/17 0427  Weight: 67 kg (147 lb 11.3 oz) 70 kg (154 lb 5.2 oz) 72 kg (158 lb 11.7 oz)    Examination:  General exam: Appears calm and comfortable, in nad. Respiratory system: Clear to auscultation. Respiratory effort normal. Equal chest rise. Cardiovascular system: S1 & S2 heard, RRR. No JVD, murmurs, rubs, gallops, or clicks Gastrointestinal system: Abdomen is nondistended, soft and nontender. No organomegaly or masses felt. Normal bowel sounds heard. Central nervous system: Alert and oriented. No focal neurological deficits. Extremities: LLE edema and erythema, warm + pulses Skin: No rashes, lesions or ulcers Psychiatry:  Mood & affect appropriate.     Data Reviewed: I have personally reviewed following labs and imaging studies  CBC: Recent Labs  Lab 10/18/17 1305 10/19/17 1837 10/20/17 0818 10/21/17 0314  WBC 9.5 10.0 8.9 7.5  NEUTROABS  --  8.0*  --   --   HGB 13.6 12.9 11.9* 11.5*  HCT 41.3 39.2 36.2 35.0*  MCV 95.6 94.7 96.0 94.6  PLT 135* 122* 105* 009*   Basic Metabolic Panel: Recent Labs  Lab 10/18/17 1305 10/19/17 1837  NA 138 138  K 4.9 3.9  CL 107 109  CO2 24 24  GLUCOSE 92 101*  BUN 16 11  CREATININE 0.93 0.96  CALCIUM 8.1* 8.3*   GFR: Estimated Creatinine Clearance: 63.2 mL/min (by  C-G formula based on SCr of 0.96 mg/dL). Liver Function Tests: Recent Labs  Lab 10/19/17 1837  AST 29  ALT 13*  ALKPHOS 71  BILITOT 0.4  PROT 6.1*  ALBUMIN 2.8*   No results for input(s): LIPASE, AMYLASE in the last 168 hours. No results for input(s): AMMONIA in the last 168 hours. Coagulation Profile: No results for input(s): INR, PROTIME in the last 168 hours. Cardiac Enzymes: Recent Labs  Lab 10/20/17 0423 10/20/17 0818 10/20/17 1935  TROPONINI 0.24* 0.28* 0.16*   BNP (last 3 results) No results for input(s): PROBNP in the last 8760 hours. HbA1C: No results for  input(s): HGBA1C in the last 72 hours. CBG: Recent Labs  Lab 10/18/17 1049  GLUCAP 85   Lipid Profile: No results for input(s): CHOL, HDL, LDLCALC, TRIG, CHOLHDL, LDLDIRECT in the last 72 hours. Thyroid Function Tests: No results for input(s): TSH, T4TOTAL, FREET4, T3FREE, THYROIDAB in the last 72 hours. Anemia Panel: No results for input(s): VITAMINB12, FOLATE, FERRITIN, TIBC, IRON, RETICCTPCT in the last 72 hours. Sepsis Labs: Recent Labs  Lab 10/19/17 1904 10/20/17 0425  LATICACIDVEN 0.83 1.30    Recent Results (from the past 240 hour(s))  MRSA PCR Screening     Status: None   Collection Time: 10/20/17  6:13 AM  Result Value Ref Range Status   MRSA by PCR NEGATIVE NEGATIVE Final    Comment:        The GeneXpert MRSA Assay (FDA approved for NASAL specimens only), is one component of a comprehensive MRSA colonization surveillance program. It is not intended to diagnose MRSA infection nor to guide or monitor treatment for MRSA infections.          Radiology Studies: Ct Angio Chest Pe W And/or Wo Contrast  Result Date: 10/20/2017 CLINICAL DATA:  46 year old female with left leg swelling and pain. Concern for pulmonary embolism. EXAM: CT ANGIOGRAPHY CHEST WITH CONTRAST TECHNIQUE: Multidetector CT imaging of the chest was performed using the standard protocol during bolus administration of intravenous contrast. Multiplanar CT image reconstructions and MIPs were obtained to evaluate the vascular anatomy. CONTRAST:  180mL ISOVUE-370 IOPAMIDOL (ISOVUE-370) INJECTION 76% COMPARISON:  None. FINDINGS: Cardiovascular: Bilateral large pulmonary emboli involving the central pulmonary artery is extending into the lobar and segmental branches of the upper and lower lobes bilaterally. There is mild cardiomegaly with dilatation of the right ventricle and right atrium. The right ventricle measures 4.1 cm in diameter and the left ventricle measures 2.4 cm in diameter with a ratio of 1.7.  There is a small pericardial effusion measuring 7 mm in thickness. The thoracic aorta is unremarkable. The origins of the great vessels of the aortic arch appear patent. Mediastinum/Nodes: No hilar or mediastinal adenopathy. Esophagus and the thyroid gland are grossly unremarkable as visualized. No mediastinal fluid collection. Lungs/Pleura: Small focal pleural based haziness at the right lung base may represent atelectatic changes or focal area of pulmonary infarct. The lungs are otherwise clear. There is no pleural effusion or pneumothorax. The central airways are patent. Upper Abdomen: No acute abnormality. Musculoskeletal: Degenerative changes of the lower cervical spine. No acute osseous pathology. Review of the MIP images confirms the above findings. IMPRESSION: 1. Positive for acute PE with CTevidence of right heart strain (RV/LV Ratio = 1.7) consistent with at least submassive (intermediate risk) PE. The presence of right heart strain has been associated with an increased risk of morbidity and mortality. 2. Focal area of atelectasis versus pulmonary infarct at the right lung base. These results  were called by telephone at the time of interpretation on 10/20/2017 at 3:22 am to physician assistant [facility] , who verbally acknowledged these results. Electronically Signed   By: Anner Crete M.D.   On: 10/20/2017 03:25   Ir Veno/ext/uni Left  Result Date: 10/20/2017 INDICATION: Left lower extremity Phlegmasia EXAM: LEFT LOWER EXTREMITY VENOUS THROMBECTOMY COMPARISON:  None. MEDICATIONS: None. 120 cc Isovue-300. ANESTHESIA/SEDATION: Versed 2 mg IV; Fentanyl 50 mcg IV Moderate Sedation Time:  46 minutes The patient was continuously monitored during the procedure by the interventional radiology nurse under my direct supervision. FLUOROSCOPY TIME:  Fluoroscopy Time: 11 minutes 30 seconds seconds (157 mGy). COMPLICATIONS: None immediate. TECHNIQUE: Informed written consent was obtained from the  patient after a thorough discussion of the procedural risks, benefits and alternatives. All questions were addressed. Maximal Sterile Barrier Technique was utilized including caps, mask, sterile gowns, sterile gloves, sterile drape, hand hygiene and skin antiseptic. A timeout was performed prior to the initiation of the procedure. In the prone position, the left popliteal fossa was prepped and draped in a sterile fashion. 1% lidocaine was utilized for local anesthesia. Under sonographic guidance, a micropuncture needle was inserted into the popliteal vein and removed over a 018 wire which was up sized to a Bentson. An 8 French sheath was inserted. Contrast was injected for venography. A vertebral catheter was then advanced over the Bentson wire and stepwise venography was performed to the IVC. Occlusive thrombus was noted in the upper femoral vein, common femoral vein, and lower left external iliac vein. Angiojet was utilized for thrombectomy of the DVT. Repeat venography demonstrates some residual thrombus in the common femoral vein The common femoral vein was then dilated to 12 mm within angioplasty device and final imaging was obtained. The sheath was removed and hemostasis was achieved with direct pressure and a hemostatic pad. FINDINGS: Initial venography demonstrates occlusive thrombus in the upper femoral vein, left common femoral vein, and lower left external iliac vein. The popliteal vein was patent. The mid and lower femoral vein were patent. The left common iliac faint and IVC were widely patent. After thrombectomy, there was marked improvement in the thrombus burden with some residual thrombus in the common femoral vein After angioplasty of the common femoral vein to 12 mm, final imaging demonstrates marked improvement with brisk antegrade flow and some residual narrowing in the left common femoral vein. IMPRESSION: Successful thrombectomy of DVT within the left external iliac vein, common femoral vein,  and upper femoral vein. Electronically Signed   By: Marybelle Killings M.D.   On: 10/20/2017 09:52   Ir Mariana Arn Ivc  Result Date: 10/20/2017 INDICATION: Left lower extremity Phlegmasia EXAM: LEFT LOWER EXTREMITY VENOUS THROMBECTOMY COMPARISON:  None. MEDICATIONS: None. 120 cc Isovue-300. ANESTHESIA/SEDATION: Versed 2 mg IV; Fentanyl 50 mcg IV Moderate Sedation Time:  46 minutes The patient was continuously monitored during the procedure by the interventional radiology nurse under my direct supervision. FLUOROSCOPY TIME:  Fluoroscopy Time: 11 minutes 30 seconds seconds (157 mGy). COMPLICATIONS: None immediate. TECHNIQUE: Informed written consent was obtained from the patient after a thorough discussion of the procedural risks, benefits and alternatives. All questions were addressed. Maximal Sterile Barrier Technique was utilized including caps, mask, sterile gowns, sterile gloves, sterile drape, hand hygiene and skin antiseptic. A timeout was performed prior to the initiation of the procedure. In the prone position, the left popliteal fossa was prepped and draped in a sterile fashion. 1% lidocaine was utilized for local anesthesia. Under sonographic guidance, a micropuncture  needle was inserted into the popliteal vein and removed over a 018 wire which was up sized to a Bentson. An 8 French sheath was inserted. Contrast was injected for venography. A vertebral catheter was then advanced over the Bentson wire and stepwise venography was performed to the IVC. Occlusive thrombus was noted in the upper femoral vein, common femoral vein, and lower left external iliac vein. Angiojet was utilized for thrombectomy of the DVT. Repeat venography demonstrates some residual thrombus in the common femoral vein The common femoral vein was then dilated to 12 mm within angioplasty device and final imaging was obtained. The sheath was removed and hemostasis was achieved with direct pressure and a hemostatic pad. FINDINGS: Initial  venography demonstrates occlusive thrombus in the upper femoral vein, left common femoral vein, and lower left external iliac vein. The popliteal vein was patent. The mid and lower femoral vein were patent. The left common iliac faint and IVC were widely patent. After thrombectomy, there was marked improvement in the thrombus burden with some residual thrombus in the common femoral vein After angioplasty of the common femoral vein to 12 mm, final imaging demonstrates marked improvement with brisk antegrade flow and some residual narrowing in the left common femoral vein. IMPRESSION: Successful thrombectomy of DVT within the left external iliac vein, common femoral vein, and upper femoral vein. Electronically Signed   By: Marybelle Killings M.D.   On: 10/20/2017 09:52   Ir Thrombect Veno Mech Mod Sed  Result Date: 10/20/2017 INDICATION: Left lower extremity Phlegmasia EXAM: LEFT LOWER EXTREMITY VENOUS THROMBECTOMY COMPARISON:  None. MEDICATIONS: None. 120 cc Isovue-300. ANESTHESIA/SEDATION: Versed 2 mg IV; Fentanyl 50 mcg IV Moderate Sedation Time:  46 minutes The patient was continuously monitored during the procedure by the interventional radiology nurse under my direct supervision. FLUOROSCOPY TIME:  Fluoroscopy Time: 11 minutes 30 seconds seconds (157 mGy). COMPLICATIONS: None immediate. TECHNIQUE: Informed written consent was obtained from the patient after a thorough discussion of the procedural risks, benefits and alternatives. All questions were addressed. Maximal Sterile Barrier Technique was utilized including caps, mask, sterile gowns, sterile gloves, sterile drape, hand hygiene and skin antiseptic. A timeout was performed prior to the initiation of the procedure. In the prone position, the left popliteal fossa was prepped and draped in a sterile fashion. 1% lidocaine was utilized for local anesthesia. Under sonographic guidance, a micropuncture needle was inserted into the popliteal vein and removed over a  018 wire which was up sized to a Bentson. An 8 French sheath was inserted. Contrast was injected for venography. A vertebral catheter was then advanced over the Bentson wire and stepwise venography was performed to the IVC. Occlusive thrombus was noted in the upper femoral vein, common femoral vein, and lower left external iliac vein. Angiojet was utilized for thrombectomy of the DVT. Repeat venography demonstrates some residual thrombus in the common femoral vein The common femoral vein was then dilated to 12 mm within angioplasty device and final imaging was obtained. The sheath was removed and hemostasis was achieved with direct pressure and a hemostatic pad. FINDINGS: Initial venography demonstrates occlusive thrombus in the upper femoral vein, left common femoral vein, and lower left external iliac vein. The popliteal vein was patent. The mid and lower femoral vein were patent. The left common iliac faint and IVC were widely patent. After thrombectomy, there was marked improvement in the thrombus burden with some residual thrombus in the common femoral vein After angioplasty of the common femoral vein to 12 mm, final imaging  demonstrates marked improvement with brisk antegrade flow and some residual narrowing in the left common femoral vein. IMPRESSION: Successful thrombectomy of DVT within the left external iliac vein, common femoral vein, and upper femoral vein. Electronically Signed   By: Marybelle Killings M.D.   On: 10/20/2017 09:52   Ir US Guide Vasc Access Left  Result Date: 10/20/2017 INDICATION: Left lower extremity Phlegmasia EXAM: LEFT LOWER EXTREMITY VENOUS THROMBECTOMY COMPARISON:  None. MEDICATIONS: None. 120 cc Isovue-300. ANESTHESIA/SEDATION: Versed 2 mg IV; Fentanyl 50 mcg IV Moderate Sedation Time:  46 minutes The patient was continuously monitored during the procedure by the interventional radiology nurse under my direct supervision. FLUOROSCOPY TIME:  Fluoroscopy Time: 11 minutes 30 seconds  seconds (157 mGy). COMPLICATIONS: None immediate. TECHNIQUE: Informed written consent was obtained from the patient after a thorough discussion of the procedural risks, benefits and alternatives. All questions were addressed. Maximal Sterile Barrier Technique was utilized including caps, mask, sterile gowns, sterile gloves, sterile drape, hand hygiene and skin antiseptic. A timeout was performed prior to the initiation of the procedure. In the prone position, the left popliteal fossa was prepped and draped in a sterile fashion. 1% lidocaine was utilized for local anesthesia. Under sonographic guidance, a micropuncture needle was inserted into the popliteal vein and removed over a 018 wire which was up sized to a Bentson. An 8 French sheath was inserted. Contrast was injected for venography. A vertebral catheter was then advanced over the Bentson wire and stepwise venography was performed to the IVC. Occlusive thrombus was noted in the upper femoral vein, common femoral vein, and lower left external iliac vein. Angiojet was utilized for thrombectomy of the DVT. Repeat venography demonstrates some residual thrombus in the common femoral vein The common femoral vein was then dilated to 12 mm within angioplasty device and final imaging was obtained. The sheath was removed and hemostasis was achieved with direct pressure and a hemostatic pad. FINDINGS: Initial venography demonstrates occlusive thrombus in the upper femoral vein, left common femoral vein, and lower left external iliac vein. The popliteal vein was patent. The mid and lower femoral vein were patent. The left common iliac faint and IVC were widely patent. After thrombectomy, there was marked improvement in the thrombus burden with some residual thrombus in the common femoral vein After angioplasty of the common femoral vein to 12 mm, final imaging demonstrates marked improvement with brisk antegrade flow and some residual narrowing in the left common femoral  vein. IMPRESSION: Successful thrombectomy of DVT within the left external iliac vein, common femoral vein, and upper femoral vein. Electronically Signed   By: Marybelle Killings M.D.   On: 10/20/2017 09:52    Scheduled Meds: . levothyroxine  50 mcg Oral QAC breakfast  . multivitamin with minerals  1 tablet Oral Daily  . sodium chloride flush  3 mL Intravenous Q12H  . tobramycin-dexamethasone  2 drop Both EARS BID   Continuous Infusions: . heparin 1,250 Units/hr (10/20/17 2137)     LOS: 1 day    Time spent: > 35 minutes  Velvet Bathe, MD [facility] Pager [phone]  If 7PM-7AM, please contact night-coverage [email or web address] Password [email or web address] 10/21/2017, 10:38 AM

## 2017-10-21 NOTE — Progress Notes (Signed)
Patient ID: Nicole Wilson, female   DOB: 06-18-1971, 46 y.o.   MRN: [id number]    Referring Physician(s): Dr. Etta Quill  Supervising Physician: Corrie Mckusick  Patient Status: [facility] - In-pt  Chief Complaint: LLE DVT  Subjective: Patient continues to feel better.  No new complaints  Allergies: Cephalexin and Dilantin [phenytoin]  Medications: Prior to Admission medications   Medication Sig Start Date End Date Taking? Authorizing Provider  ibuprofen (ADVIL,MOTRIN) 200 MG tablet Take 200 mg by mouth every 6 (six) hours as needed for mild pain.    Yes [provider]  levothyroxine (SYNTHROID, LEVOTHROID) 100 MCG tablet Take 50 mcg by mouth daily.  07/19/13  Yes [provider]  Multiple Vitamin (MULTIVITAMIN) tablet Take 1 tablet by mouth daily.   Yes [provider]  tobramycin-dexamethasone [facility]) ophthalmic solution Place 2 drops into both ears 2 (two) times daily. 09/20/17  Yes [provider]  doxycycline (VIBRAMYCIN) 100 MG capsule Take 100 mg by mouth 2 (two) times daily. 10/14/17   [provider]    Vital Signs: BP (!) 85/45 (BP Location: Left Arm)   Pulse 72   Temp 98.4 F (36.9 C) (Oral)   Resp 16   Ht 4\' 11"  (1.499 m)   Wt 158 lb 11.7 oz (72 kg)   SpO2 100%   BMI 32.06 kg/m   Physical Exam: Ext: LLE still with edema, but nontender, posterior pop site is c/d/i. Palpable pedal pulse  Imaging: Ct Angio Chest Pe W And/or Wo Contrast  Result Date: 10/20/2017 CLINICAL DATA:  46 year old female with left leg swelling and pain. Concern for pulmonary embolism. EXAM: CT ANGIOGRAPHY CHEST WITH CONTRAST TECHNIQUE: Multidetector CT imaging of the chest was performed using the standard protocol during bolus administration of intravenous contrast. Multiplanar CT image reconstructions and MIPs were obtained to evaluate the vascular anatomy. CONTRAST:  180mL ISOVUE-370 IOPAMIDOL (ISOVUE-370) INJECTION 76% COMPARISON:  None. FINDINGS:  Cardiovascular: Bilateral large pulmonary emboli involving the central pulmonary artery is extending into the lobar and segmental branches of the upper and lower lobes bilaterally. There is mild cardiomegaly with dilatation of the right ventricle and right atrium. The right ventricle measures 4.1 cm in diameter and the left ventricle measures 2.4 cm in diameter with a ratio of 1.7. There is a small pericardial effusion measuring 7 mm in thickness. The thoracic aorta is unremarkable. The origins of the great vessels of the aortic arch appear patent. Mediastinum/Nodes: No hilar or mediastinal adenopathy. Esophagus and the thyroid gland are grossly unremarkable as visualized. No mediastinal fluid collection. Lungs/Pleura: Small focal pleural based haziness at the right lung base may represent atelectatic changes or focal area of pulmonary infarct. The lungs are otherwise clear. There is no pleural effusion or pneumothorax. The central airways are patent. Upper Abdomen: No acute abnormality. Musculoskeletal: Degenerative changes of the lower cervical spine. No acute osseous pathology. Review of the MIP images confirms the above findings. IMPRESSION: 1. Positive for acute PE with CTevidence of right heart strain (RV/LV Ratio = 1.7) consistent with at least submassive (intermediate risk) PE. The presence of right heart strain has been associated with an increased risk of morbidity and mortality. 2. Focal area of atelectasis versus pulmonary infarct at the right lung base. These results were called by telephone at the time of interpretation on 10/20/2017 at 3:22 am to physician assistant [facility] , who verbally acknowledged these results. Electronically Signed   By: Anner Crete M.D.   On: 10/20/2017 03:25  Ir Veno/ext/uni Left  Result Date: 10/20/2017 INDICATION: Left lower extremity Phlegmasia EXAM: LEFT LOWER EXTREMITY VENOUS THROMBECTOMY COMPARISON:  None. MEDICATIONS: None. 120 cc Isovue-300.  ANESTHESIA/SEDATION: Versed 2 mg IV; Fentanyl 50 mcg IV Moderate Sedation Time:  46 minutes The patient was continuously monitored during the procedure by the interventional radiology nurse under my direct supervision. FLUOROSCOPY TIME:  Fluoroscopy Time: 11 minutes 30 seconds seconds (157 mGy). COMPLICATIONS: None immediate. TECHNIQUE: Informed written consent was obtained from the patient after a thorough discussion of the procedural risks, benefits and alternatives. All questions were addressed. Maximal Sterile Barrier Technique was utilized including caps, mask, sterile gowns, sterile gloves, sterile drape, hand hygiene and skin antiseptic. A timeout was performed prior to the initiation of the procedure. In the prone position, the left popliteal fossa was prepped and draped in a sterile fashion. 1% lidocaine was utilized for local anesthesia. Under sonographic guidance, a micropuncture needle was inserted into the popliteal vein and removed over a 018 wire which was up sized to a Bentson. An 8 French sheath was inserted. Contrast was injected for venography. A vertebral catheter was then advanced over the Bentson wire and stepwise venography was performed to the IVC. Occlusive thrombus was noted in the upper femoral vein, common femoral vein, and lower left external iliac vein. Angiojet was utilized for thrombectomy of the DVT. Repeat venography demonstrates some residual thrombus in the common femoral vein The common femoral vein was then dilated to 12 mm within angioplasty device and final imaging was obtained. The sheath was removed and hemostasis was achieved with direct pressure and a hemostatic pad. FINDINGS: Initial venography demonstrates occlusive thrombus in the upper femoral vein, left common femoral vein, and lower left external iliac vein. The popliteal vein was patent. The mid and lower femoral vein were patent. The left common iliac faint and IVC were widely patent. After thrombectomy, there was  marked improvement in the thrombus burden with some residual thrombus in the common femoral vein After angioplasty of the common femoral vein to 12 mm, final imaging demonstrates marked improvement with brisk antegrade flow and some residual narrowing in the left common femoral vein. IMPRESSION: Successful thrombectomy of DVT within the left external iliac vein, common femoral vein, and upper femoral vein. Electronically Signed   By: Marybelle Killings M.D.   On: 10/20/2017 09:52   Ir Mariana Arn Ivc  Result Date: 10/20/2017 INDICATION: Left lower extremity Phlegmasia EXAM: LEFT LOWER EXTREMITY VENOUS THROMBECTOMY COMPARISON:  None. MEDICATIONS: None. 120 cc Isovue-300. ANESTHESIA/SEDATION: Versed 2 mg IV; Fentanyl 50 mcg IV Moderate Sedation Time:  46 minutes The patient was continuously monitored during the procedure by the interventional radiology nurse under my direct supervision. FLUOROSCOPY TIME:  Fluoroscopy Time: 11 minutes 30 seconds seconds (157 mGy). COMPLICATIONS: None immediate. TECHNIQUE: Informed written consent was obtained from the patient after a thorough discussion of the procedural risks, benefits and alternatives. All questions were addressed. Maximal Sterile Barrier Technique was utilized including caps, mask, sterile gowns, sterile gloves, sterile drape, hand hygiene and skin antiseptic. A timeout was performed prior to the initiation of the procedure. In the prone position, the left popliteal fossa was prepped and draped in a sterile fashion. 1% lidocaine was utilized for local anesthesia. Under sonographic guidance, a micropuncture needle was inserted into the popliteal vein and removed over a 018 wire which was up sized to a Bentson. An 8 French sheath was inserted. Contrast was injected for venography. A vertebral catheter was then advanced over the Bentson wire  and stepwise venography was performed to the IVC. Occlusive thrombus was noted in the upper femoral vein, common femoral vein, and  lower left external iliac vein. Angiojet was utilized for thrombectomy of the DVT. Repeat venography demonstrates some residual thrombus in the common femoral vein The common femoral vein was then dilated to 12 mm within angioplasty device and final imaging was obtained. The sheath was removed and hemostasis was achieved with direct pressure and a hemostatic pad. FINDINGS: Initial venography demonstrates occlusive thrombus in the upper femoral vein, left common femoral vein, and lower left external iliac vein. The popliteal vein was patent. The mid and lower femoral vein were patent. The left common iliac faint and IVC were widely patent. After thrombectomy, there was marked improvement in the thrombus burden with some residual thrombus in the common femoral vein After angioplasty of the common femoral vein to 12 mm, final imaging demonstrates marked improvement with brisk antegrade flow and some residual narrowing in the left common femoral vein. IMPRESSION: Successful thrombectomy of DVT within the left external iliac vein, common femoral vein, and upper femoral vein. Electronically Signed   By: Marybelle Killings M.D.   On: 10/20/2017 09:52   Ir Thrombect Veno Mech Mod Sed  Result Date: 10/20/2017 INDICATION: Left lower extremity Phlegmasia EXAM: LEFT LOWER EXTREMITY VENOUS THROMBECTOMY COMPARISON:  None. MEDICATIONS: None. 120 cc Isovue-300. ANESTHESIA/SEDATION: Versed 2 mg IV; Fentanyl 50 mcg IV Moderate Sedation Time:  46 minutes The patient was continuously monitored during the procedure by the interventional radiology nurse under my direct supervision. FLUOROSCOPY TIME:  Fluoroscopy Time: 11 minutes 30 seconds seconds (157 mGy). COMPLICATIONS: None immediate. TECHNIQUE: Informed written consent was obtained from the patient after a thorough discussion of the procedural risks, benefits and alternatives. All questions were addressed. Maximal Sterile Barrier Technique was utilized including caps, mask, sterile  gowns, sterile gloves, sterile drape, hand hygiene and skin antiseptic. A timeout was performed prior to the initiation of the procedure. In the prone position, the left popliteal fossa was prepped and draped in a sterile fashion. 1% lidocaine was utilized for local anesthesia. Under sonographic guidance, a micropuncture needle was inserted into the popliteal vein and removed over a 018 wire which was up sized to a Bentson. An 8 French sheath was inserted. Contrast was injected for venography. A vertebral catheter was then advanced over the Bentson wire and stepwise venography was performed to the IVC. Occlusive thrombus was noted in the upper femoral vein, common femoral vein, and lower left external iliac vein. Angiojet was utilized for thrombectomy of the DVT. Repeat venography demonstrates some residual thrombus in the common femoral vein The common femoral vein was then dilated to 12 mm within angioplasty device and final imaging was obtained. The sheath was removed and hemostasis was achieved with direct pressure and a hemostatic pad. FINDINGS: Initial venography demonstrates occlusive thrombus in the upper femoral vein, left common femoral vein, and lower left external iliac vein. The popliteal vein was patent. The mid and lower femoral vein were patent. The left common iliac faint and IVC were widely patent. After thrombectomy, there was marked improvement in the thrombus burden with some residual thrombus in the common femoral vein After angioplasty of the common femoral vein to 12 mm, final imaging demonstrates marked improvement with brisk antegrade flow and some residual narrowing in the left common femoral vein. IMPRESSION: Successful thrombectomy of DVT within the left external iliac vein, common femoral vein, and upper femoral vein. Electronically Signed   By: Arnell Sieving  Hoss M.D.   On: 10/20/2017 09:52   Ir US Guide Vasc Access Left  Result Date: 10/20/2017 INDICATION: Left lower extremity  Phlegmasia EXAM: LEFT LOWER EXTREMITY VENOUS THROMBECTOMY COMPARISON:  None. MEDICATIONS: None. 120 cc Isovue-300. ANESTHESIA/SEDATION: Versed 2 mg IV; Fentanyl 50 mcg IV Moderate Sedation Time:  46 minutes The patient was continuously monitored during the procedure by the interventional radiology nurse under my direct supervision. FLUOROSCOPY TIME:  Fluoroscopy Time: 11 minutes 30 seconds seconds (157 mGy). COMPLICATIONS: None immediate. TECHNIQUE: Informed written consent was obtained from the patient after a thorough discussion of the procedural risks, benefits and alternatives. All questions were addressed. Maximal Sterile Barrier Technique was utilized including caps, mask, sterile gowns, sterile gloves, sterile drape, hand hygiene and skin antiseptic. A timeout was performed prior to the initiation of the procedure. In the prone position, the left popliteal fossa was prepped and draped in a sterile fashion. 1% lidocaine was utilized for local anesthesia. Under sonographic guidance, a micropuncture needle was inserted into the popliteal vein and removed over a 018 wire which was up sized to a Bentson. An 8 French sheath was inserted. Contrast was injected for venography. A vertebral catheter was then advanced over the Bentson wire and stepwise venography was performed to the IVC. Occlusive thrombus was noted in the upper femoral vein, common femoral vein, and lower left external iliac vein. Angiojet was utilized for thrombectomy of the DVT. Repeat venography demonstrates some residual thrombus in the common femoral vein The common femoral vein was then dilated to 12 mm within angioplasty device and final imaging was obtained. The sheath was removed and hemostasis was achieved with direct pressure and a hemostatic pad. FINDINGS: Initial venography demonstrates occlusive thrombus in the upper femoral vein, left common femoral vein, and lower left external iliac vein. The popliteal vein was patent. The mid and  lower femoral vein were patent. The left common iliac faint and IVC were widely patent. After thrombectomy, there was marked improvement in the thrombus burden with some residual thrombus in the common femoral vein After angioplasty of the common femoral vein to 12 mm, final imaging demonstrates marked improvement with brisk antegrade flow and some residual narrowing in the left common femoral vein. IMPRESSION: Successful thrombectomy of DVT within the left external iliac vein, common femoral vein, and upper femoral vein. Electronically Signed   By: Marybelle Killings M.D.   On: 10/20/2017 09:52    Labs:  CBC: Recent Labs    10/18/17 1305 10/19/17 1837 10/20/17 0818 10/21/17 0314  WBC 9.5 10.0 8.9 7.5  HGB 13.6 12.9 11.9* 11.5*  HCT 41.3 39.2 36.2 35.0*  PLT 135* 122* 105* 115*    COAGS: No results for input(s): INR, APTT in the last 8760 hours.  BMP: Recent Labs    01/01/17 1546 10/18/17 1305 10/19/17 1837  NA 139 138 138  K 3.6 4.9 3.9  CL 110 107 109  CO2 24 24 24   GLUCOSE 103* 92 101*  BUN 17 16 11   CALCIUM 8.4* 8.1* 8.3*  CREATININE 1.19* 0.93 0.96  GFRNONAA 54* >60 >60  GFRAA >60 >60 >60    LIVER FUNCTION TESTS: Recent Labs    10/19/17 1837  BILITOT 0.4  AST 29  ALT 13*  ALKPHOS 71  PROT 6.1*  ALBUMIN 2.8*    Assessment and Plan: 1. LLE DVT, s/p lysis  Patient continues to improve after her procedure.  Cont on medical management/anticoagulation per primary service. No further intervention planned per IR  Electronically  Signed: Henreitta Cea 10/21/2017, 4:27 PM   I spent a total of 15 Minutes at the the patient's bedside AND on the patient's hospital floor or unit, greater than 50% of which was counseling/coordinating care for LLE DVT

## 2017-10-21 NOTE — Progress Notes (Signed)
Patient arrived to room with parents at bedside. No needs requested at this time-will continue to monitor

## 2017-10-21 NOTE — Progress Notes (Signed)
[facility] for heparin Indication: pulmonary embolus and DVT  Allergies  Allergen Reactions  . Cephalexin Rash  . Dilantin [Phenytoin] Other (See Comments)    Pt doesnt remember    Patient Measurements: Height: 4\' 11"  (149.9 cm) Weight: 158 lb 11.7 oz (72 kg) IBW/kg (Calculated) : 43.2 Heparin Dosing Weight: 58.8 kg  Vital Signs: Temp: 97.6 F (36.4 C) (12/06 0753) Temp Source: Oral (12/06 0753) BP: 106/71 (12/06 0753) Pulse Rate: 68 (12/06 0753)  Labs: Recent Labs    10/18/17 1305 10/19/17 1837 10/20/17 0423 10/20/17 0818 10/20/17 1033 10/20/17 1935 10/21/17 0314  HGB 13.6 12.9  --  11.9*  --   --  11.5*  HCT 41.3 39.2  --  36.2  --   --  35.0*  PLT 135* 122*  --  105*  --   --  115*  HEPARINUNFRC  --   --   --   --  0.12* 0.56 0.57  CREATININE 0.93 0.96  --   --   --   --   --   TROPONINI  --   --  0.24* 0.28*  --  0.16*  --     Estimated Creatinine Clearance: 63.2 mL/min (by C-G formula based on SCr of 0.96 mg/dL).   Medical History: Past Medical History:  Diagnosis Date  . Allergic rhinitis   . Bradycardia   . Carotid artery occlusion    bilateral carotid artery bypass for Moya Moya  . Cervical spondylosis   . Down's syndrome   . Dyslipidemia   . Hidradenitis   . Hyperlipidemia   . Hypothyroidism   . MVP (mitral valve prolapse)    resolved on echo 2016  . Obesity (BMI 30-39.9) 06/01/2017  . Orthostatic hypotension     Assessment: 22 yof found to have acute PE with RHS (RV/LV ratio 1.7), LLE DVT now s/p thrombectomy on 12/5. No indication for thrombolytics/IR for PE currently per CCM. Not on anticoagulation PTA.  Heparin level remains therapeutic. Hg low stable, plt up to 115. No bleed documented.  Goal of Therapy:  Heparin level 0.3-0.7 units/ml Monitor platelets by anticoagulation protocol: Yes   Plan:  Continue heparin IV at 1250 units/hr Daily heparin level/CBC Monitor for s/sx bleeding F/u plans  for long-term anticoagulation  Elicia Lamp, PharmD, BCPS Clinical Pharmacist Clinical phone for 10/21/2017 until 3:30pm: [phone] If after 3:30pm, please call main pharmacy at: [phone] 10/21/2017 9:55 AM

## 2017-10-21 NOTE — Care Management Note (Signed)
Case Management Note  Patient Details  Name: TYMIKA GRILLI MRN: [id number] Date of Birth: 24-Dec-1970  Subjective/Objective:     Pt admitted with left lower extremity swelling and syncopal episode               Action/Plan:  PTA from home with parents - pt has down syndrome but she is independent with mobility and ADLs.  CM submitted benefit check all all requested meds (xarelto, pradaxa and eliquis will cost $3 ).  CM spoke with parents and the plan is for them to take daughter home at discharge.   CM will continue to follow for discharge needs    Expected Discharge Date:                  Expected Discharge Plan:  Home/Self Care  In-House Referral:     Discharge planning Services  CM Consult  Post Acute Care Choice:    Choice offered to:     DME Arranged:    DME Agency:     HH Arranged:    HH Agency:     Status of Service:  In process, will continue to follow  If discussed at Long Length of Stay Meetings, dates discussed:    Additional Comments:  Maryclare Labrador, RN 10/21/2017, 2:00 PM

## 2017-10-21 NOTE — Progress Notes (Signed)
  Echocardiogram 2D Echocardiogram has been performed.  Darlina Sicilian M 10/21/2017, 1:33 PM

## 2017-10-22 DIAGNOSIS — I2609 Other pulmonary embolism with acute cor pulmonale: Secondary | ICD-10-CM

## 2017-10-22 LAB — CBC
HCT: 33.2 % — ABNORMAL LOW (ref 36.0–46.0)
Hemoglobin: 10.8 g/dL — ABNORMAL LOW (ref 12.0–15.0)
MCH: 31.2 pg (ref 26.0–34.0)
MCHC: 32.5 g/dL (ref 30.0–36.0)
MCV: 96 fL (ref 78.0–100.0)
PLATELETS: 121 10*3/uL — AB (ref 150–400)
RBC: 3.46 MIL/uL — AB (ref 3.87–5.11)
RDW: 15.2 % (ref 11.5–15.5)
WBC: 6.5 10*3/uL (ref 4.0–10.5)

## 2017-10-22 LAB — HOMOCYSTEINE: HOMOCYSTEINE-NORM: 6.8 umol/L (ref 0.0–15.0)

## 2017-10-22 LAB — HEPARIN LEVEL (UNFRACTIONATED): Heparin Unfractionated: 0.47 IU/mL (ref 0.30–0.70)

## 2017-10-22 LAB — ANA W/REFLEX IF POSITIVE: ANA: NEGATIVE

## 2017-10-22 MED ORDER — APIXABAN 5 MG PO TABS
10.0000 mg | ORAL_TABLET | Freq: Two times a day (BID) | ORAL | 0 refills | Status: DC
Start: 2017-10-22 — End: 2017-11-15

## 2017-10-22 MED ORDER — APIXABAN 5 MG PO TABS
10.0000 mg | ORAL_TABLET | Freq: Two times a day (BID) | ORAL | Status: DC
  Administered 2017-10-22: 10 mg via ORAL
  Filled 2017-10-22: qty 2

## 2017-10-22 NOTE — Discharge Summary (Signed)
Physician Discharge Summary  Nicole Wilson JJO:[id number] DOB: 1971-02-22 DOA: 10/19/2017  PCP: Nicole Fess, MD  Admit date: 10/19/2017 Discharge date: 10/22/2017  Time spent: > 35 minutes  Recommendations for Outpatient Follow-up:  1. Please ensure patient follows up with hematologist for further evaluation and recommendations.   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 female with medical history significant for trisomy 21, moyamoya status post bilateral carotid artery bypass, and hypothyroidism, now presenting to the emergency department for evaluation of left lower extremity swelling, redness, and pain.  Diagnosed with new DVT (requiring thrombectomy) and PE.   Hospital Course:   Principal Problem:   Pulmonary embolism with acute cor pulmonale (HCC) - No procedures planned by specialist - Pt was on IV heparin for several days > 2 then transitioned to NOAC - Etiology uncertain at this point suspected to be secondary to immobility (pt sits in chairs for long periods of time due to back pain)  Active Problems:   Hypothyroidism - Stable on synthroid. Will continue home regimen on d/c    Down syndrome   Left leg DVT (HCC) - Pt had thrombectomy, no further procedures planned by specialist. - continue anticoagulation on d/c  Procedures:  none  Consultations:  Pulmonology  Discharge Exam: Vitals:   10/22/17 0911 10/22/17 1412  BP: 99/85 (!) 99/54  Pulse: 73 73  Resp:    Temp: 98.2 F (36.8 C) 98.8 F (37.1 C)  SpO2: 100% 93%     General: Pt in nad, alert and awake Cardiovascular: no cyanosis Respiratory: no increased wob, no wheezes  Discharge Instructions    Allergies as of 10/22/2017      Reactions   Cephalexin Rash   Dilantin [phenytoin] Other (See Comments)   Pt doesnt remember      Medication List    STOP taking these medications   doxycycline 100 MG capsule Commonly known as:  VIBRAMYCIN   ibuprofen 200 MG tablet Commonly known as:  ADVIL,MOTRIN     TAKE these medications   apixaban 5 MG Tabs tablet Commonly known as:  ELIQUIS Take 2 tablets (10 mg total) by mouth 2 (two) times daily.   levothyroxine 100 MCG tablet Commonly known as:  SYNTHROID, LEVOTHROID Take 50 mcg by mouth daily.   multivitamin tablet Take 1 tablet by mouth daily.   tobramycin-dexamethasone ophthalmic solution Commonly known as:  TOBRADEX Place 2 drops into both ears 2 (two) times daily.      Allergies  Allergen Reactions  . Cephalexin Rash  . Dilantin [Phenytoin] Other (See Comments)    Pt doesnt remember      The results of significant diagnostics from this hospitalization (including imaging, microbiology, ancillary and laboratory) are listed below for reference.    Significant Diagnostic Studies: Ct Angio Chest Pe W And/or Wo Contrast  Result Date: 10/20/2017 CLINICAL DATA:  46 year old female with left leg swelling and pain. Concern for pulmonary embolism. EXAM: CT ANGIOGRAPHY CHEST WITH CONTRAST TECHNIQUE: Multidetector CT imaging of the chest was performed using the  standard protocol during bolus administration of intravenous contrast. Multiplanar CT image reconstructions and MIPs were obtained to evaluate the vascular anatomy. CONTRAST:  186mL ISOVUE-370 IOPAMIDOL (ISOVUE-370) INJECTION 76% COMPARISON:  None. FINDINGS: Cardiovascular: Bilateral large pulmonary emboli involving the central pulmonary artery is extending into the lobar and segmental branches of the upper and lower lobes bilaterally.  There is mild cardiomegaly with dilatation of the right ventricle and right atrium. The right ventricle measures 4.1 cm in diameter and the left ventricle measures 2.4 cm in diameter with a ratio of 1.7. There is a small pericardial effusion measuring 7 mm in thickness. The thoracic aorta is unremarkable. The origins of the great vessels of the aortic arch appear patent. Mediastinum/Nodes: No hilar or mediastinal adenopathy. Esophagus and the thyroid gland are grossly unremarkable as visualized. No mediastinal fluid collection. Lungs/Pleura: Small focal pleural based haziness at the right lung base may represent atelectatic changes or focal area of pulmonary infarct. The lungs are otherwise clear. There is no pleural effusion or pneumothorax. The central airways are patent. Upper Abdomen: No acute abnormality. Musculoskeletal: Degenerative changes of the lower cervical spine. No acute osseous pathology. Review of the MIP images confirms the above findings. IMPRESSION: 1. Positive for acute PE with CTevidence of right heart strain (RV/LV Ratio = 1.7) consistent with at least submassive (intermediate risk) PE. The presence of right heart strain has been associated with an increased risk of morbidity and mortality. 2. Focal area of atelectasis versus pulmonary infarct at the right lung base. These results were called by telephone at the time of interpretation on 10/20/2017 at 3:22 am to physician assistant [facility] , who verbally acknowledged these results. Electronically Signed   By: Anner Crete M.D.   On: 10/20/2017 03:25   Ir Veno/ext/uni Left  Result Date: 10/20/2017 INDICATION: Left lower extremity Phlegmasia EXAM: LEFT LOWER EXTREMITY VENOUS THROMBECTOMY COMPARISON:  None. MEDICATIONS: None. 120 cc Isovue-300. ANESTHESIA/SEDATION: Versed 2 mg IV; Fentanyl 50 mcg IV Moderate Sedation Time:  46 minutes The patient was continuously monitored during the procedure by the interventional radiology  nurse under my direct supervision. FLUOROSCOPY TIME:  Fluoroscopy Time: 11 minutes 30 seconds seconds (157 mGy). COMPLICATIONS: None immediate. TECHNIQUE: Informed written consent was obtained from the patient after a thorough discussion of the procedural risks, benefits and alternatives. All questions were addressed. Maximal Sterile Barrier Technique was utilized including caps, mask, sterile gowns, sterile gloves, sterile drape, hand hygiene and skin antiseptic. A timeout was performed prior to the initiation of the procedure. In the prone position, the left popliteal fossa was prepped and draped in a sterile fashion. 1% lidocaine was utilized for local anesthesia. Under sonographic guidance, a micropuncture needle was inserted into the popliteal vein and removed over a 018 wire which was up sized to a Bentson. An 8 French sheath was inserted. Contrast was injected for venography. A vertebral catheter was then advanced over the Bentson wire and stepwise venography was performed to the IVC. Occlusive thrombus was noted in the upper femoral vein, common femoral vein, and lower left external iliac vein. Angiojet was utilized for thrombectomy of the DVT. Repeat venography demonstrates some residual thrombus in the common femoral vein The common femoral vein was then dilated to 12 mm within angioplasty device and final imaging was obtained. The sheath was removed and hemostasis was achieved with direct pressure and a hemostatic pad. FINDINGS: Initial venography demonstrates occlusive thrombus in the upper femoral vein, left common femoral vein, and lower left external iliac vein.  The popliteal vein was patent. The mid and lower femoral vein were patent. The left common iliac faint and IVC were widely patent. After thrombectomy, there was marked improvement in the thrombus burden with some residual thrombus in the common femoral vein After angioplasty of the common femoral vein to 12 mm, final imaging demonstrates  marked improvement with brisk antegrade flow and some residual narrowing in the left common femoral vein. IMPRESSION: Successful thrombectomy of DVT within the left external iliac vein, common femoral vein, and upper femoral vein. Electronically Signed   By: Marybelle Killings M.D.   On: 10/20/2017 09:52   Ir Mariana Arn Ivc  Result Date: 10/20/2017 INDICATION: Left lower extremity Phlegmasia EXAM: LEFT LOWER EXTREMITY VENOUS THROMBECTOMY COMPARISON:  None. MEDICATIONS: None. 120 cc Isovue-300. ANESTHESIA/SEDATION: Versed 2 mg IV; Fentanyl 50 mcg IV Moderate Sedation Time:  46 minutes The patient was continuously monitored during the procedure by the interventional radiology nurse under my direct supervision. FLUOROSCOPY TIME:  Fluoroscopy Time: 11 minutes 30 seconds seconds (157 mGy). COMPLICATIONS: None immediate. TECHNIQUE: Informed written consent was obtained from the patient after a thorough discussion of the procedural risks, benefits and alternatives. All questions were addressed. Maximal Sterile Barrier Technique was utilized including caps, mask, sterile gowns, sterile gloves, sterile drape, hand hygiene and skin antiseptic. A timeout was performed prior to the initiation of the procedure. In the prone position, the left popliteal fossa was prepped and draped in a sterile fashion. 1% lidocaine was utilized for local anesthesia. Under sonographic guidance, a micropuncture needle was inserted into the popliteal vein and removed over a 018 wire which was up sized to a Bentson. An 8 French sheath was inserted. Contrast was injected for venography. A vertebral catheter was then advanced over the Bentson wire and stepwise venography was performed to the IVC. Occlusive thrombus was noted in the upper femoral vein, common femoral vein, and lower left external iliac vein. Angiojet was utilized for thrombectomy of the DVT. Repeat venography demonstrates some residual thrombus in the common femoral vein The common  femoral vein was then dilated to 12 mm within angioplasty device and final imaging was obtained. The sheath was removed and hemostasis was achieved with direct pressure and a hemostatic pad. FINDINGS: Initial venography demonstrates occlusive thrombus in the upper femoral vein, left common femoral vein, and lower left external iliac vein. The popliteal vein was patent. The mid and lower femoral vein were patent. The left common iliac faint and IVC were widely patent. After thrombectomy, there was marked improvement in the thrombus burden with some residual thrombus in the common femoral vein After angioplasty of the common femoral vein to 12 mm, final imaging demonstrates marked improvement with brisk antegrade flow and some residual narrowing in the left common femoral vein. IMPRESSION: Successful thrombectomy of DVT within the left external iliac vein, common femoral vein, and upper femoral vein. Electronically Signed   By: Marybelle Killings M.D.   On: 10/20/2017 09:52   Ir Thrombect Veno Mech Mod Sed  Result Date: 10/20/2017 INDICATION: Left lower extremity Phlegmasia EXAM: LEFT LOWER EXTREMITY VENOUS THROMBECTOMY COMPARISON:  None. MEDICATIONS: None. 120 cc Isovue-300. ANESTHESIA/SEDATION: Versed 2 mg IV; Fentanyl 50 mcg IV Moderate Sedation Time:  46 minutes The patient was continuously monitored during the procedure by the interventional radiology nurse under my direct supervision. FLUOROSCOPY TIME:  Fluoroscopy Time: 11 minutes 30 seconds seconds (157 mGy). COMPLICATIONS: None immediate. TECHNIQUE: Informed written consent was obtained from the patient after a thorough discussion of the procedural  risks, benefits and alternatives. All questions were addressed. Maximal Sterile Barrier Technique was utilized including caps, mask, sterile gowns, sterile gloves, sterile drape, hand hygiene and skin antiseptic. A timeout was performed prior to the initiation of the procedure. In the prone position, the left  popliteal fossa was prepped and draped in a sterile fashion. 1% lidocaine was utilized for local anesthesia. Under sonographic guidance, a micropuncture needle was inserted into the popliteal vein and removed over a 018 wire which was up sized to a Bentson. An 8 French sheath was inserted. Contrast was injected for venography. A vertebral catheter was then advanced over the Bentson wire and stepwise venography was performed to the IVC. Occlusive thrombus was noted in the upper femoral vein, common femoral vein, and lower left external iliac vein. Angiojet was utilized for thrombectomy of the DVT. Repeat venography demonstrates some residual thrombus in the common femoral vein The common femoral vein was then dilated to 12 mm within angioplasty device and final imaging was obtained. The sheath was removed and hemostasis was achieved with direct pressure and a hemostatic pad. FINDINGS: Initial venography demonstrates occlusive thrombus in the upper femoral vein, left common femoral vein, and lower left external iliac vein. The popliteal vein was patent. The mid and lower femoral vein were patent. The left common iliac faint and IVC were widely patent. After thrombectomy, there was marked improvement in the thrombus burden with some residual thrombus in the common femoral vein After angioplasty of the common femoral vein to 12 mm, final imaging demonstrates marked improvement with brisk antegrade flow and some residual narrowing in the left common femoral vein. IMPRESSION: Successful thrombectomy of DVT within the left external iliac vein, common femoral vein, and upper femoral vein. Electronically Signed   By: Marybelle Killings M.D.   On: 10/20/2017 09:52   Ir US Guide Vasc Access Left  Result Date: 10/20/2017 INDICATION: Left lower extremity Phlegmasia EXAM: LEFT LOWER EXTREMITY VENOUS THROMBECTOMY COMPARISON:  None. MEDICATIONS: None. 120 cc Isovue-300. ANESTHESIA/SEDATION: Versed 2 mg IV; Fentanyl 50 mcg IV Moderate  Sedation Time:  46 minutes The patient was continuously monitored during the procedure by the interventional radiology nurse under my direct supervision. FLUOROSCOPY TIME:  Fluoroscopy Time: 11 minutes 30 seconds seconds (157 mGy). COMPLICATIONS: None immediate. TECHNIQUE: Informed written consent was obtained from the patient after a thorough discussion of the procedural risks, benefits and alternatives. All questions were addressed. Maximal Sterile Barrier Technique was utilized including caps, mask, sterile gowns, sterile gloves, sterile drape, hand hygiene and skin antiseptic. A timeout was performed prior to the initiation of the procedure. In the prone position, the left popliteal fossa was prepped and draped in a sterile fashion. 1% lidocaine was utilized for local anesthesia. Under sonographic guidance, a micropuncture needle was inserted into the popliteal vein and removed over a 018 wire which was up sized to a Bentson. An 8 French sheath was inserted. Contrast was injected for venography. A vertebral catheter was then advanced over the Bentson wire and stepwise venography was performed to the IVC. Occlusive thrombus was noted in the upper femoral vein, common femoral vein, and lower left external iliac vein. Angiojet was utilized for thrombectomy of the DVT. Repeat venography demonstrates some residual thrombus in the common femoral vein The common femoral vein was then dilated to 12 mm within angioplasty device and final imaging was obtained. The sheath was removed and hemostasis was achieved with direct pressure and a hemostatic pad. FINDINGS: Initial venography demonstrates occlusive thrombus in the  upper femoral vein, left common femoral vein, and lower left external iliac vein. The popliteal vein was patent. The mid and lower femoral vein were patent. The left common iliac faint and IVC were widely patent. After thrombectomy, there was marked improvement in the thrombus burden with some residual  thrombus in the common femoral vein After angioplasty of the common femoral vein to 12 mm, final imaging demonstrates marked improvement with brisk antegrade flow and some residual narrowing in the left common femoral vein. IMPRESSION: Successful thrombectomy of DVT within the left external iliac vein, common femoral vein, and upper femoral vein. Electronically Signed   By: Marybelle Killings M.D.   On: 10/20/2017 09:52    Microbiology: Recent Results (from the past 240 hour(s))  MRSA PCR Screening     Status: None   Collection Time: 10/20/17  6:13 AM  Result Value Ref Range Status   MRSA by PCR NEGATIVE NEGATIVE Final    Comment:        The GeneXpert MRSA Assay (FDA approved for NASAL specimens only), is one component of a comprehensive MRSA colonization surveillance program. It is not intended to diagnose MRSA infection nor to guide or monitor treatment for MRSA infections.      Labs: Basic Metabolic Panel: Recent Labs  Lab 10/18/17 1305 10/19/17 1837  NA 138 138  K 4.9 3.9  CL 107 109  CO2 24 24  GLUCOSE 92 101*  BUN 16 11  CREATININE 0.93 0.96  CALCIUM 8.1* 8.3*   Liver Function Tests: Recent Labs  Lab 10/19/17 1837  AST 29  ALT 13*  ALKPHOS 71  BILITOT 0.4  PROT 6.1*  ALBUMIN 2.8*   No results for input(s): LIPASE, AMYLASE in the last 168 hours. No results for input(s): AMMONIA in the last 168 hours. CBC: Recent Labs  Lab 10/18/17 1305 10/19/17 1837 10/20/17 0818 10/21/17 0314 10/22/17 0555  WBC 9.5 10.0 8.9 7.5 6.5  NEUTROABS  --  8.0*  --   --   --   HGB 13.6 12.9 11.9* 11.5* 10.8*  HCT 41.3 39.2 36.2 35.0* 33.2*  MCV 95.6 94.7 96.0 94.6 96.0  PLT 135* 122* 105* 115* 121*   Cardiac Enzymes: Recent Labs  Lab 10/20/17 0423 10/20/17 0818 10/20/17 1935  TROPONINI 0.24* 0.28* 0.16*   BNP: BNP (last 3 results) Recent Labs    10/20/17 0401  BNP 149.1*    ProBNP (last 3 results) No results for input(s): PROBNP in the last 8760  hours.  CBG: Recent Labs  Lab 10/18/17 1049  GLUCAP 85    Signed:  Velvet Bathe MD.  [facility] 10/22/2017, 2:17 PM

## 2017-10-22 NOTE — Progress Notes (Addendum)
[place] for heparin to Eliquis Indication: pulmonary embolus and DVT  Allergies  Allergen Reactions  . Cephalexin Rash  . Dilantin [Phenytoin] Other (See Comments)    Pt doesnt remember    Patient Measurements: Height: 4\' 11"  (149.9 cm) Weight: 158 lb 11.7 oz (72 kg) IBW/kg (Calculated) : 43.2 Heparin Dosing Weight: 58.8 kg  Vital Signs: Temp: 98.4 F (36.9 C) (12/07 0545) Temp Source: Oral (12/07 0545) BP: 105/44 (12/07 0545) Pulse Rate: 67 (12/07 0545)  Labs: Recent Labs    10/19/17 1837 10/20/17 0423 10/20/17 0818  10/20/17 1935 10/21/17 0314 10/22/17 0555  HGB 12.9  --  11.9*  --   --  11.5* 10.8*  HCT 39.2  --  36.2  --   --  35.0* 33.2*  PLT 122*  --  105*  --   --  115* 121*  HEPARINUNFRC  --   --   --    < > 0.56 0.57 0.47  CREATININE 0.96  --   --   --   --   --   --   TROPONINI  --  0.24* 0.28*  --  0.16*  --   --    < > = values in this interval not displayed.    Estimated Creatinine Clearance: 63.2 mL/min (by C-G formula based on SCr of 0.96 mg/dL).     Assessment: 22 yof found to have acute PE with RHS (RV/LV ratio 1.7), LLE DVT now s/p thrombectomy on 12/5. No indication for thrombolytics/IR for PE currently per CCM. Not on anticoagulation PTA.  Heparin level remains therapeutic.  Transitioning to Eliquis today  Goal of Therapy:  Heparin level 0.3-0.7 units/ml Monitor platelets by anticoagulation protocol: Yes   Plan:  DC heparin and heparin labs Eliquis 10 mg po BID x 7 days then 5 mg po BID Eliquis starter pack added to dc orders  Thank you Anette Guarneri, PharmD [phone]

## 2017-10-22 NOTE — Consult Note (Signed)
            [facility] CM Primary Care Navigator  10/22/2017  Nicole Wilson 05/31/1971 [id number]    Went to see patient at the bedside to identify possible discharge needs but she was alreadydischarged per staff report.  Patient was discharged home today.  Primary care provider's officeis listed as providingtransition of care (TOC).   For additional questions please contact:  Edwena Felty A. Latashia Koch, BSN, RN-BC [facility] PRIMARY CARE Navigator Cell: [phone]

## 2017-10-22 NOTE — Care Management Note (Signed)
Case Management Note  Patient Details  Name: AKANE TESSIER MRN: [id number] Date of Birth: May 05, 1971  Subjective/Objective:                    Action/Plan: Pt discharging on Eliquis. CM provided her parents the 30 day free Eliquis card. From benefits check her co pay should be $3 per month. Family aware.  Parents will provide supervision and transportation home. No further needs per CM.  Expected Discharge Date:                  Expected Discharge Plan:  Home/Self Care  In-House Referral:     Discharge planning Services  CM Consult  Post Acute Care Choice:    Choice offered to:     DME Arranged:    DME Agency:     HH Arranged:    [place] Agency:     Status of Service:  Completed, signed off  If discussed at [employer] of Stay Meetings, dates discussed:    Additional Comments:  Pollie Friar, RN 10/22/2017, 12:58 PM

## 2017-10-22 NOTE — Progress Notes (Signed)
Patient discharge teaching complete. Meds, diet, activity, follow up appointments reviewed and all questions answered. Patient discharged via wheelchair with mother and father.

## 2017-10-26 DIAGNOSIS — R55 Syncope and collapse: Secondary | ICD-10-CM | POA: Diagnosis not present

## 2017-10-26 DIAGNOSIS — R531 Weakness: Secondary | ICD-10-CM | POA: Diagnosis not present

## 2017-10-26 DIAGNOSIS — E860 Dehydration: Secondary | ICD-10-CM | POA: Diagnosis not present

## 2017-10-26 DIAGNOSIS — Q909 Down syndrome, unspecified: Secondary | ICD-10-CM | POA: Diagnosis not present

## 2017-10-26 DIAGNOSIS — I2699 Other pulmonary embolism without acute cor pulmonale: Secondary | ICD-10-CM | POA: Diagnosis not present

## 2017-10-26 DIAGNOSIS — R079 Chest pain, unspecified: Secondary | ICD-10-CM | POA: Diagnosis not present

## 2017-10-26 DIAGNOSIS — R0602 Shortness of breath: Secondary | ICD-10-CM | POA: Diagnosis not present

## 2017-10-27 LAB — PROTHROMBIN GENE MUTATION

## 2017-10-29 DIAGNOSIS — I2699 Other pulmonary embolism without acute cor pulmonale: Secondary | ICD-10-CM | POA: Diagnosis not present

## 2017-10-29 DIAGNOSIS — N390 Urinary tract infection, site not specified: Secondary | ICD-10-CM | POA: Diagnosis not present

## 2017-10-29 DIAGNOSIS — I82402 Acute embolism and thrombosis of unspecified deep veins of left lower extremity: Secondary | ICD-10-CM | POA: Diagnosis not present

## 2017-10-29 DIAGNOSIS — E039 Hypothyroidism, unspecified: Secondary | ICD-10-CM | POA: Diagnosis not present

## 2017-11-01 ENCOUNTER — Telehealth: Payer: Self-pay | Admitting: Oncology

## 2017-11-01 ENCOUNTER — Telehealth: Payer: Self-pay | Admitting: *Deleted

## 2017-11-01 DIAGNOSIS — I2782 Chronic pulmonary embolism: Secondary | ICD-10-CM | POA: Diagnosis not present

## 2017-11-01 DIAGNOSIS — Q909 Down syndrome, unspecified: Secondary | ICD-10-CM | POA: Diagnosis not present

## 2017-11-01 DIAGNOSIS — H60331 Swimmer's ear, right ear: Secondary | ICD-10-CM | POA: Diagnosis not present

## 2017-11-01 DIAGNOSIS — H903 Sensorineural hearing loss, bilateral: Secondary | ICD-10-CM | POA: Diagnosis not present

## 2017-11-01 DIAGNOSIS — H6983 Other specified disorders of Eustachian tube, bilateral: Secondary | ICD-10-CM | POA: Diagnosis not present

## 2017-11-01 DIAGNOSIS — H9313 Tinnitus, bilateral: Secondary | ICD-10-CM | POA: Diagnosis not present

## 2017-11-01 NOTE — Telephone Encounter (Signed)
Spoke with pt's father, she is scheduled to see Dr. Benay Spice 12/24. He would like to keep that appt.

## 2017-11-01 NOTE — Telephone Encounter (Signed)
Spoke with patients father and [place] @ [employer] regarding appt date/time/location.

## 2017-11-05 ENCOUNTER — Ambulatory Visit (HOSPITAL_BASED_OUTPATIENT_CLINIC_OR_DEPARTMENT_OTHER): Payer: Medicare Other | Admitting: Oncology

## 2017-11-05 ENCOUNTER — Telehealth: Payer: Self-pay | Admitting: Oncology

## 2017-11-05 ENCOUNTER — Encounter: Payer: Self-pay | Admitting: Oncology

## 2017-11-05 VITALS — BP 118/68 | HR 65 | Temp 98.4°F | Resp 20 | Ht 59.0 in | Wt 144.1 lb

## 2017-11-05 DIAGNOSIS — E669 Obesity, unspecified: Secondary | ICD-10-CM

## 2017-11-05 DIAGNOSIS — I2699 Other pulmonary embolism without acute cor pulmonale: Secondary | ICD-10-CM | POA: Diagnosis not present

## 2017-11-05 DIAGNOSIS — I82432 Acute embolism and thrombosis of left popliteal vein: Secondary | ICD-10-CM

## 2017-11-05 DIAGNOSIS — Q909 Down syndrome, unspecified: Secondary | ICD-10-CM

## 2017-11-05 DIAGNOSIS — I82402 Acute embolism and thrombosis of unspecified deep veins of left lower extremity: Secondary | ICD-10-CM

## 2017-11-05 DIAGNOSIS — R600 Localized edema: Secondary | ICD-10-CM | POA: Diagnosis not present

## 2017-11-05 NOTE — Telephone Encounter (Signed)
Gave avs and calendar for march °

## 2017-11-05 NOTE — Progress Notes (Signed)
[facility] Patient Consult   Referring MD: [name], [place], [place] [address]   Nicole Wilson 46 y.o.  January 30, 1971    Reason for Referral: Left leg deep vein thrombosis, pulmonary embolism   HPI: Nicole Wilson is a 47 year old with a history of Down syndrome.  She is here today with her parents and sister. She presented to the emergency room 10/18/2017 after near syncope.  Her blood pressure was low.  She was given intravenous fluids and discharged home.  Her family report she has a history of repeat episodes of presyncope. The following day she developed swelling and pain in the left leg.  An occlusive acute DVT was noted in the common femoral, femoral, profunda, and popliteal vein.  Acute superficial thrombosis of the greater saphenous vein.  Interventional radiology was consulted and she was taken to a thrombectomy procedure 10/20/2017.  Repeat venography demonstrated residual thrombus in the common femoral vein.  The common femoral vein was dilated.  There was marked improvement in the thrombus burden after the procedure.  She was started on heparin anticoagulation.  A CT of the chest 10/20/2017 revealed bilateral large pulmonary emboli involving the central pulmonary artery extending into the lobar and segmental branches.  Mild cardiomegaly with dilatation of the right ventricle and right atrium.  She was discharged 10/22/2017 on apixaban. [place].  Her family reports she was [place] beach [place] 10[place]2017 with shortness of breath and chest pain.  She had a repeat CT and per the family's report there were no new findings.  She was treated for a urinary tract infection.  She feels better at present.  The left leg remains swollen, but has improved.  She has no previous history of venous or arterial thrombosis.  No recent surgical procedure or prolonged bedrest.  No prolonged travel.  She is  generally inactive at home.  Nicole Wilson Past Medical History:  Diagnosis Date  . Allergic rhinitis   . Bradycardia   . Carotid artery occlusion    bilateral carotid artery bypass for Moya Moya  . Cervical spondylosis   . Down's syndrome   . Dyslipidemia   . Hidradenitis   . Hyperlipidemia   . Hypothyroidism   . MVP (mitral valve prolapse)    resolved on echo 2016  . Obesity (BMI 30-39.9) 06/01/2017  . Orthostatic hypotension     .  Extensive left leg DVT and extensive bilateral pulmonary embolism                              10/19/2017   .  Back pain, status post epidural steroid injections   .  Recurrent ear infections Past Surgical History:  Procedure Laterality Date  . carotid artery surgery    . IR THROMBECT VENO MECH MOD SED  10/20/2017  . IR US GUIDE VASC ACCESS LEFT  10/20/2017  . IR VENO/EXT/UNI LEFT  10/20/2017  . IR VENOCAVAGRAM IVC  10/20/2017  . MIDDLE EAR SURGERY    . TONSILLECTOMY AND ADENOIDECTOMY      .   Lumbar fusion  2017  Medications: Reviewed  Allergies:  Allergies  Allergen Reactions  . Cephalexin Rash  . Dilantin [Phenytoin] Other (See Comments)    Pt doesnt remember    Family history: Her father had a right lower extremity DVT at the time of HIT diagnosis.  No other family history of thrombosis.  Social History:   She lives with her parents.  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  She does not use cigarettes or alcohol.  No transfusion history.  ROS:   Positives include: Nocturia, chronic tingling at the left face and upper/lower extremities, chest pain and exertional dyspnea December 2018, history of intermittent presyncope events, recurrent ear infections  A complete ROS was otherwise negative.  Physical Exam:  Blood pressure 118/68, pulse 65, temperature 98.4 F (36.9 C), temperature source  Oral, resp. rate 20, height 4\' 11"  (1.499 m), weight 144 lb 1.6 oz (65.4 kg), SpO2 100 %.  HEENT: Oropharynx without visible mass, neck without mass Lungs: Clear bilaterally, no respiratory distress Cardiac: Regular rate and rhythm Abdomen: No hepatosplenomegaly, no mass, nontender  Vascular: Mild edema throughout the left leg and foot.  No tenderness.  No palpable cord. Lymph nodes: No cervical, supraclavicular, axillary, or inguinal nodes Neurologic: Alert and oriented, the motor exam appears intact in the upper and lower extremities Skin: No rash Musculoskeletal: No spine tenderness Breast: Bilateral breast without mass   LAB:  CBC  Lab Results  Component Value Date   WBC 6.5 10/22/2017   HGB 10.8 (L) 10/22/2017   HCT 33.2 (L) 10/22/2017   MCV 96.0 10/22/2017   PLT 121 (L) 10/22/2017   NEUTROABS 8.0 (H) 10/19/2017      Prothrombin gene mutation-negative             Homocystine-normal  CMP     Component Value Date/Time   NA 138 10/19/2017 1837   K 3.9 10/19/2017 1837   CL 109 10/19/2017 1837   CO2 24 10/19/2017 1837   GLUCOSE 101 (H) 10/19/2017 1837   BUN 11 10/19/2017 1837   CREATININE 0.96 10/19/2017 1837   CALCIUM 8.3 (L) 10/19/2017 1837   PROT 6.1 (L) 10/19/2017 1837   ALBUMIN 2.8 (L) 10/19/2017 1837   AST 29 10/19/2017 1837   ALT 13 (L) 10/19/2017 1837   ALKPHOS 71 10/19/2017 1837   BILITOT 0.4 10/19/2017 1837   GFRNONAA >60 10/19/2017 1837   GFRAA >60 10/19/2017 1837     Imaging:  As per HPI, CT chest 10/20/2017-images reviewed   Assessment/Plan:   1. Extensive left lower extremity DVT and bilateral pulmonary embolism 02/17/2017  Presentation with a syncope event 10/18/2017  Left lower extremity Doppler 10/19/2017 confirmed extensive left lower deep and superficial venous thrombosis  CT chest 10/20/2017 with bilateral central pulmonary emboli with right heart strain  Left lower extremity thrombectomy procedure 10/20/2017  Heparin anticoagulation  followed by apixaban  2.   Down syndrome  3.   History of bilateral carotid artery surgery for treatment of moyamoya disease  4.   Recurrent ear infections  5.   Low back pain, status post lumbar fusion January 2017   Disposition:   Nicole Wilson was diagnosed with an extensive left lower extremity DVT and bilateral pulmonary embolism earlier this month.  She is now maintained on apixaban anticoagulation after undergoing a left leg thrombectomy procedure. She has persistent mild edema throughout the left leg, likely postphlebitic syndrome.  Her only known risk factors for venous thrombosis or immobility and  mild obesity.  There were no apparent transient risk factors associated with the episode of venous thromboembolic disease.  Her history does not suggest an underlying malignancy.  I recommend indefinite anticoagulation therapy due to the life-threatening nature of the thromboembolic event.  She will continue apixaban.  I recommended she obtain a support stocking for the left leg.  I do not recommend additional testing for a hypercoagulation syndrome.  She will return for an office visit in 3 months.  50 minutes were spent with the patient today.  The majority of the time was used for counseling and coordination of care.   Betsy Coder, MD  11/05/2017, 4:59 PM

## 2017-11-08 ENCOUNTER — Encounter: Payer: Medicare Other | Admitting: Oncology

## 2017-11-15 ENCOUNTER — Other Ambulatory Visit: Payer: Self-pay | Admitting: Nurse Practitioner

## 2017-11-15 DIAGNOSIS — I82402 Acute embolism and thrombosis of unspecified deep veins of left lower extremity: Secondary | ICD-10-CM

## 2017-11-15 MED ORDER — APIXABAN 5 MG PO TABS: 5.0000 mg | ORAL_TABLET | Freq: Two times a day (BID) | ORAL | 0 refills | Status: DC

## 2017-12-09 DIAGNOSIS — I675 Moyamoya disease: Secondary | ICD-10-CM | POA: Diagnosis not present

## 2017-12-09 DIAGNOSIS — Q909 Down syndrome, unspecified: Secondary | ICD-10-CM | POA: Diagnosis not present

## 2017-12-09 DIAGNOSIS — M48061 Spinal stenosis, lumbar region without neurogenic claudication: Secondary | ICD-10-CM | POA: Diagnosis not present

## 2017-12-09 DIAGNOSIS — M4316 Spondylolisthesis, lumbar region: Secondary | ICD-10-CM | POA: Diagnosis not present

## 2017-12-09 DIAGNOSIS — I69398 Other sequelae of cerebral infarction: Secondary | ICD-10-CM | POA: Diagnosis not present

## 2017-12-09 DIAGNOSIS — Z8673 Personal history of transient ischemic attack (TIA), and cerebral infarction without residual deficits: Secondary | ICD-10-CM | POA: Diagnosis not present

## 2017-12-09 DIAGNOSIS — I69354 Hemiplegia and hemiparesis following cerebral infarction affecting left non-dominant side: Secondary | ICD-10-CM | POA: Diagnosis not present

## 2017-12-09 DIAGNOSIS — Z86718 Personal history of other venous thrombosis and embolism: Secondary | ICD-10-CM | POA: Diagnosis not present

## 2017-12-09 DIAGNOSIS — Z981 Arthrodesis status: Secondary | ICD-10-CM | POA: Diagnosis not present

## 2017-12-09 DIAGNOSIS — R52 Pain, unspecified: Secondary | ICD-10-CM | POA: Diagnosis not present

## 2017-12-09 DIAGNOSIS — R200 Anesthesia of skin: Secondary | ICD-10-CM | POA: Diagnosis not present

## 2017-12-09 DIAGNOSIS — Z7901 Long term (current) use of anticoagulants: Secondary | ICD-10-CM | POA: Diagnosis not present

## 2017-12-23 ENCOUNTER — Other Ambulatory Visit: Payer: Self-pay | Admitting: Nurse Practitioner

## 2017-12-23 DIAGNOSIS — I82402 Acute embolism and thrombosis of unspecified deep veins of left lower extremity: Secondary | ICD-10-CM

## 2017-12-28 DIAGNOSIS — E7800 Pure hypercholesterolemia, unspecified: Secondary | ICD-10-CM | POA: Diagnosis not present

## 2017-12-28 DIAGNOSIS — Z7901 Long term (current) use of anticoagulants: Secondary | ICD-10-CM | POA: Diagnosis not present

## 2017-12-28 DIAGNOSIS — E039 Hypothyroidism, unspecified: Secondary | ICD-10-CM | POA: Diagnosis not present

## 2017-12-28 DIAGNOSIS — Z86711 Personal history of pulmonary embolism: Secondary | ICD-10-CM | POA: Diagnosis not present

## 2017-12-28 DIAGNOSIS — Z0000 Encounter for general adult medical examination without abnormal findings: Secondary | ICD-10-CM | POA: Diagnosis not present

## 2017-12-28 DIAGNOSIS — Q909 Down syndrome, unspecified: Secondary | ICD-10-CM | POA: Diagnosis not present

## 2018-01-06 ENCOUNTER — Other Ambulatory Visit: Payer: Self-pay

## 2018-01-06 ENCOUNTER — Inpatient Hospital Stay: Payer: 59 | Attending: Nurse Practitioner | Admitting: Nurse Practitioner

## 2018-01-06 ENCOUNTER — Telehealth: Payer: Self-pay | Admitting: *Deleted

## 2018-01-06 ENCOUNTER — Emergency Department (HOSPITAL_COMMUNITY)
Admission: EM | Admit: 2018-01-06 | Discharge: 2018-01-07 | Disposition: A | Discharge status: Home / Self Care | Payer: 59 | Attending: Emergency Medicine | Admitting: Emergency Medicine

## 2018-01-06 ENCOUNTER — Emergency Department (HOSPITAL_COMMUNITY): Payer: 59

## 2018-01-06 ENCOUNTER — Encounter: Payer: Self-pay | Admitting: Nurse Practitioner

## 2018-01-06 ENCOUNTER — Encounter (HOSPITAL_COMMUNITY): Payer: Self-pay | Admitting: Emergency Medicine

## 2018-01-06 VITALS — BP 98/70 | HR 57 | Temp 97.9°F | Resp 18 | Ht 59.0 in | Wt 141.4 lb

## 2018-01-06 DIAGNOSIS — R0789 Other chest pain: Secondary | ICD-10-CM | POA: Diagnosis not present

## 2018-01-06 DIAGNOSIS — Z7901 Long term (current) use of anticoagulants: Secondary | ICD-10-CM | POA: Diagnosis not present

## 2018-01-06 DIAGNOSIS — I82402 Acute embolism and thrombosis of unspecified deep veins of left lower extremity: Secondary | ICD-10-CM | POA: Diagnosis not present

## 2018-01-06 DIAGNOSIS — I959 Hypotension, unspecified: Secondary | ICD-10-CM | POA: Diagnosis not present

## 2018-01-06 DIAGNOSIS — Z86711 Personal history of pulmonary embolism: Secondary | ICD-10-CM | POA: Insufficient documentation

## 2018-01-06 DIAGNOSIS — Z86718 Personal history of other venous thrombosis and embolism: Secondary | ICD-10-CM | POA: Insufficient documentation

## 2018-01-06 DIAGNOSIS — R55 Syncope and collapse: Secondary | ICD-10-CM | POA: Insufficient documentation

## 2018-01-06 DIAGNOSIS — Q909 Down syndrome, unspecified: Secondary | ICD-10-CM

## 2018-01-06 DIAGNOSIS — I2699 Other pulmonary embolism without acute cor pulmonale: Secondary | ICD-10-CM | POA: Diagnosis not present

## 2018-01-06 DIAGNOSIS — I951 Orthostatic hypotension: Secondary | ICD-10-CM | POA: Insufficient documentation

## 2018-01-06 LAB — CBC
HEMATOCRIT: 44.1 % (ref 36.0–46.0)
Hemoglobin: 15.1 g/dL — ABNORMAL HIGH (ref 12.0–15.0)
MCH: 32.2 pg (ref 26.0–34.0)
MCHC: 34.2 g/dL (ref 30.0–36.0)
MCV: 94 fL (ref 78.0–100.0)
Platelets: 364 10*3/uL (ref 150–400)
RBC: 4.69 MIL/uL (ref 3.87–5.11)
RDW: 15.6 % — ABNORMAL HIGH (ref 11.5–15.5)
WBC: 5.2 10*3/uL (ref 4.0–10.5)

## 2018-01-06 LAB — COMPREHENSIVE METABOLIC PANEL
ALT: 19 U/L (ref 14–54)
AST: 31 U/L (ref 15–41)
Albumin: 3.5 g/dL (ref 3.5–5.0)
Alkaline Phosphatase: 63 U/L (ref 38–126)
Anion gap: 9 (ref 5–15)
BUN: 18 mg/dL (ref 6–20)
CHLORIDE: 108 mmol/L (ref 101–111)
CO2: 24 mmol/L (ref 22–32)
Calcium: 8.9 mg/dL (ref 8.9–10.3)
Creatinine, Ser: 0.93 mg/dL (ref 0.44–1.00)
Glucose, Bld: 88 mg/dL (ref 65–99)
POTASSIUM: 4.4 mmol/L (ref 3.5–5.1)
SODIUM: 141 mmol/L (ref 135–145)
Total Bilirubin: 0.8 mg/dL (ref 0.3–1.2)
Total Protein: 6.7 g/dL (ref 6.5–8.1)

## 2018-01-06 LAB — BRAIN NATRIURETIC PEPTIDE: B Natriuretic Peptide: 15.5 pg/mL (ref 0.0–100.0)

## 2018-01-06 LAB — I-STAT TROPONIN, ED: TROPONIN I, POC: 0 ng/mL (ref 0.00–0.08)

## 2018-01-06 MED ORDER — IOPAMIDOL (ISOVUE-370) INJECTION 76%
INTRAVENOUS | Status: AC
  Administered 2018-01-06: 100 mL via INTRAVENOUS
  Filled 2018-01-06: qty 100

## 2018-01-06 MED ORDER — SODIUM CHLORIDE 0.9 % IV BOLUS (SEPSIS)
1000.0000 mL | Freq: Once | INTRAVENOUS | Status: AC
  Administered 2018-01-06: 1000 mL via INTRAVENOUS

## 2018-01-06 NOTE — ED Notes (Signed)
Bed: [facility]1 Expected date:  Expected time:  Means of arrival:  Comments: 

## 2018-01-06 NOTE — ED Notes (Signed)
Patient given cranberry juice

## 2018-01-06 NOTE — ED Notes (Signed)
Went in to discharge patient and patient and family member were no longer in the room.

## 2018-01-06 NOTE — Telephone Encounter (Signed)
Reviewed call with Dr. Benay Spice. Returned call to pt's father with appt for 2:15 today. He reports she is in no distress at this time. Thinks anxiety may be contributing to her dyspnea. Wonders if she may benefit from anxiolytic.

## 2018-01-06 NOTE — ED Provider Notes (Addendum)
[facility] DEPT Provider Note  CSN: [id number] Arrival date & time: 01/06/18 1623  Chief Complaint(s) Hypotension  HPI Nicole Wilson is a 47 y.o. female with a history of Down syndrome, bradycardia, orthostasis, recent diagnosis of pulmonary embolism on Eliquis presents to the emergency department with recurrent episodes of orthostasis.  Patient was seen by hematologist who noted orthostasis of the patient and sent her here for further evaluation.  Patient has been complaining of intermittent chest discomfort.  They are concerned for recurrence of PE.  Patient and family reported that the patient's chest discomfort is intermittent in nature usually associated with anxiety.  However given her history of PEs they were concerned that this might be a recurrence.  They deny recent fevers or infections.  No nausea or vomiting.  No abdominal pain.  No current chest pain or shortness of breath.  No urinary symptoms.  Father reports that at baseline the patient has low blood pressures in the high 90s and low 100s.  HPI  Past Medical History Past Medical History:  Diagnosis Date  . Allergic rhinitis   . Bradycardia   . Carotid artery occlusion    bilateral carotid artery bypass for Moya Moya  . Cervical spondylosis   . Down's syndrome   . Dyslipidemia   . Hidradenitis   . Hyperlipidemia   . Hypothyroidism   . MVP (mitral valve prolapse)    resolved on echo 2016  . Obesity (BMI 30-39.9) 06/01/2017  . Orthostatic hypotension    Patient Active Problem List   Diagnosis Date Noted  . Left leg DVT ([place]) 10/20/2017  . Pulmonary embolism with acute cor pulmonale ([place]) 10/20/2017  . 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 Obesity (BMI 30-39.9) 06/01/2017  . HLD (hyperlipidemia) 11/25/2015  . Spondylolisthesis of lumbosacral region 11/25/2015  . Cervical stenosis of spinal canal 03/16/2014  . Chronic chest wall pain 10/06/2013  .  Bradycardia   . Carotid artery occlusion   . MVP (mitral valve prolapse)   . Hypothyroidism   . Orthostatic hypotension   . Down syndrome   . Hidradenitis   . Dyslipidemia   . Allergic rhinitis   . Moyamoya disease 03/03/2012   Home Medication(s) Prior to Admission medications   Medication Sig Start Date End Date Taking? Authorizing Provider  acetaminophen (TYLENOL 8 HOUR) 650 MG CR tablet Take 1 tablet by mouth 2 (two) times daily.   Yes [provider]  ELIQUIS 5 MG TABS tablet TAKE 1 TABLET BY MOUTH TWICE DAILY 12/23/17  Yes Ladell Pier, MD  levothyroxine (SYNTHROID, LEVOTHROID) 100 MCG tablet Take 50 mcg by mouth daily.  07/19/13  Yes [provider]                                                                                                                                    Past Surgical History Past Surgical  History:  Procedure Laterality Date  . carotid artery surgery    . IR THROMBECT VENO MECH MOD SED  10/20/2017  . IR US GUIDE VASC ACCESS LEFT  10/20/2017  . IR VENO/EXT/UNI LEFT  10/20/2017  . IR VENOCAVAGRAM IVC  10/20/2017  . MIDDLE EAR SURGERY    . TONSILLECTOMY AND ADENOIDECTOMY     Family History Family History  Problem Relation Age of Onset  . Multiple myeloma Father   . Prostate cancer Father   . Heart attack Neg Hx   . Stroke Neg Hx     Social History Social History   Tobacco Use  . Smoking status: Never Smoker  . Smokeless tobacco: Never Used  Substance Use Topics  . Alcohol use: No  . Drug use: No   Allergies Cephalexin and Dilantin [phenytoin]  Review of Systems Review of Systems All other systems are reviewed and are negative for acute change except as noted in the HPI  Physical Exam Vital Signs  I have reviewed the triage vital signs BP 109/67   Pulse 67   Temp 98.4 F (36.9 C) (Oral)   Resp 18   SpO2 97%   Physical Exam  Constitutional: She is oriented to person, place, and time. She appears well-developed  and well-nourished. No distress.  Down's facies  HENT:  Head: Normocephalic and atraumatic.  Nose: Nose normal.  Eyes: Conjunctivae and EOM are normal. Pupils are equal, round, and reactive to light. Right eye exhibits no discharge. Left eye exhibits no discharge. No scleral icterus.  Neck: Normal range of motion. Neck supple.  Cardiovascular: Normal rate and regular rhythm. Exam reveals no gallop and no friction rub.  No murmur heard. Pulmonary/Chest: Effort normal and breath sounds normal. No stridor. No respiratory distress. She has no rales.  Abdominal: Soft. She exhibits no distension. There is no tenderness.  Musculoskeletal: She exhibits no edema or tenderness.  Left leg edema (baseline per family)  Neurological: She is alert and oriented to person, place, and time.  Skin: Skin is warm and dry. No rash noted. She is not diaphoretic. No erythema.  Psychiatric: She has a normal mood and affect.  Vitals reviewed.   ED Results and Treatments Labs (all labs ordered are listed, but only abnormal results are displayed) Labs Reviewed  CBC - Abnormal; Notable for the following components:      Result Value   Hemoglobin 15.1 (*)    RDW 15.6 (*)    All other components within normal limits  BRAIN NATRIURETIC PEPTIDE  COMPREHENSIVE METABOLIC PANEL  I-STAT TROPONIN, ED                                                                                                                         EKG  EKG Interpretation  Date/Time:  01/06/2018  17:35:52 Ventricular Rate:   62 PR Interval:    QRS Duration:  91 QT Interval:   417 QTC Calculation:  424 R Axis:  Text Interpretation:  normal sinus rhythm. Borderline right axis deviation. No significant changes when compared to prior tracings.      Radiology Ct Angio Chest Pe W And/or Wo Contrast  Result Date: 01/06/2018 CLINICAL DATA:  Hypotension history of PE EXAM: CT ANGIOGRAPHY CHEST WITH CONTRAST TECHNIQUE: Multidetector CT imaging  of the chest was performed using the standard protocol during bolus administration of intravenous contrast. Multiplanar CT image reconstructions and MIPs were obtained to evaluate the vascular anatomy. CONTRAST:  159m ISOVUE-370 IOPAMIDOL (ISOVUE-370) INJECTION 76% COMPARISON:  Chest CT chest 10/20/2017 FINDINGS: Cardiovascular: Satisfactory opacification of the pulmonary arteries to the segmental level. Resolution of previously noted large bilateral emboli. No definite acute filling defects identified on the current study. Nonaneurysmal aorta. Heart size mildly enlarged. No pericardial effusion. Mediastinum/Nodes: Possible hypodense 13 mm nodule in the right lobe of thyroid. Midline trachea. No significantly enlarged lymph nodes. Esophagus within normal limits. Lungs/Pleura: Lungs are clear. No pleural effusion or pneumothorax. Upper Abdomen: No acute abnormality. Musculoskeletal: No chest wall abnormality. No acute or significant osseous findings. Review of the MIP images confirms the above findings. IMPRESSION: 1. Resolution of previously noted large bilateral pulmonary emboli. No definite acute pulmonary embolus seen on today's study. 2. Clear lung fields 3. Borderline to mild cardiomegaly. Electronically Signed   By: KDonavan FoilM.D.   On: 01/06/2018 22:28   Pertinent labs & imaging results that were available during my care of the patient were reviewed by me and considered in my medical decision making (see chart for details).  Medications Ordered in ED Medications  sodium chloride 0.9 % bolus 1,000 mL (0 mLs Intravenous Stopped 01/06/18 2054)  iopamidol (ISOVUE-370) 76 % injection (100 mLs Intravenous Contrast Given 01/06/18 2146)                                                                                                                                    Procedures Procedures Emergency Ultrasound Study:   Angiocath insertion Performed by: PFatima Blank Consent: Verbal consent  obtained. Risks and benefits: risks, benefits and alternatives were discussed Immediately prior to procedure the correct patient, procedure, equipment, support staff and site/side marked as needed.  Indication: difficult IV access Preparation: Patient and probe were prepped with chlorhexidine or alcohol. Sterile gel used. Vein Location: left cephalic vein was visualized during assessment for potential access sites and was found to be patent/ easily compressed with linear ultrasound.  1.88 inch, 20 gauge needle was visualized with real-time ultrasound and guided into the vein.  Image saved and stored.  Normal blood return.  Patient tolerance: Patient tolerated the procedure well with no immediate complications.   (including critical care time)  Medical Decision Making / ED Course I have reviewed the nursing notes for this encounter and the patient's prior records (if available in EHR or on provided paperwork).    Patient is afebrile with stable vital signs.  Orthostatics reassuring.  Labs reassuring without evidence of anemia or significant electrolyte derangements.  EKG nonischemic.  CTA revealed resolved PEs without acute process.  Patient is able to tolerate oral intake.  The patient appears reasonably screened and/or stabilized for discharge and I doubt any other medical condition or other [facility] requiring further screening, evaluation, or treatment in the ED at this time prior to discharge.  The patient is safe for discharge with strict return precautions.   Final Clinical Impression(s) / ED Diagnoses Final diagnoses:  Orthostasis   Disposition: Discharge  Condition: Good  I have discussed the results, Dx and Tx plan with the patient who expressed understanding and agree(s) with the plan. Discharge instructions discussed at great length. The patient was given strict return precautions who verbalized understanding of the instructions. No further questions at time of discharge.     ED Discharge Orders    None       Follow Up: Rankins, Bill Salinas, MD [place] [address] [phone]  Schedule an appointment as soon as possible for a visit  As needed      This chart was dictated using voice recognition software.  Despite best efforts to proofread,  errors can occur which can change the documentation meaning.     Fatima Blank, MD 01/06/18 2324    Fatima Blank, MD 01/12/18 1007

## 2018-01-06 NOTE — Progress Notes (Addendum)
[facility] OFFICE PROGRESS NOTE   Diagnosis: Left leg DVT, pulmonary embolism  INTERVAL HISTORY:   Nicole Wilson returns prior to scheduled follow-up for evaluation of dyspnea.  She is accompanied by her parents who assist with the review of systems.  For the past few days to 1 week she has had intermittent tightness/pain in her chest unrelated to exertion.  When this occurs she feels short of breath.  She denies fever.  No cough.  No change in chronic left leg swelling.  She is on Eliquis and has not missed any doses.  She had an episode of "disorientation" yesterday.  She has had similar occurrences in the past.  The episodes tend to happen when she goes from a sitting or lying to standing position.  Her dad think some of her symptoms may be related to anxiety.  Recent good oral intake.  Objective:  Vital signs in last 24 hours:  Blood pressure 98/70, pulse (!) 57, temperature 97.9 F (36.6 C), temperature source Oral, resp. rate 18, height 4\' 11"  (1.499 m), weight 141 lb 6.4 oz (64.1 kg), SpO2 100 %.    HEENT: No thrush or ulcers. Resp: Lungs clear bilaterally. Cardio: Regular rate and rhythm. GI: Abdomen is soft and nontender.  No hepatosplenomegaly. Vascular: Left leg is larger than the right lower leg.   Lab Results:  Lab Results  Component Value Date   WBC 6.5 10/22/2017   HGB 10.8 (L) 10/22/2017   HCT 33.2 (L) 10/22/2017   MCV 96.0 10/22/2017   PLT 121 (L) 10/22/2017   NEUTROABS 8.0 (H) 10/19/2017    Imaging:  No results found.  Medications: I have reviewed the patient's current medications.  Assessment/Plan: 1. Extensive left lower extremity DVT and bilateral pulmonary embolism 10/19/2017 ? Presentation with a syncope event 10/18/2017 ? Left lower extremity Doppler 10/19/2017 confirmed extensive left lower deep and superficial venous thrombosis ? CT chest 10/20/2017 with bilateral central pulmonary emboli with right heart strain ? Left lower  extremity thrombectomy procedure 10/20/2017 ? Heparin anticoagulation followed by apixaban  2.   Down syndrome  3.   History of bilateral carotid artery surgery for treatment of moyamoya disease  4.   Recurrent ear infections  5.   Low back pain, status post lumbar fusion January 2017    Disposition: Nicole Wilson is a 47 year old woman with a history of an extensive left leg DVT and bilateral PE in December 2018.  She is status post a left lower extremity thrombectomy procedure.  She is maintained on Eliquis.  She presents today with complaints of intermittent chest pain and dyspnea.  While in the office she had multiple presyncopal episodes with bradycardia/hypotension.  We are referring her to the emergency department for evaluation.  Patient seen with Dr. Benay Spice.  40 minutes were spent face-to-face at today's visit with the majority of that time involved in counseling/coordination of care.    Ned Card ANP/GNP-BC   01/06/2018  3:27 PM This was a shared visit with Ned Card.  Nicole Wilson was interviewed and examined.  We discussed the situation at length with her parents.  The etiology of her current symptoms is unclear.  She has hypotension, dyspnea, and presyncope.  She was noted to be bradycardic a pulse in the 40s when she attempted to sit on the examination table.  She was unable to ambulate.  I contacted the emergency room physician.  She will be referred to the emergency room for further evaluation.  Recurrent pulmonary  embolism versus a primary cardiac abnormality are in the differential diagnosis.  Julieanne Manson, MD

## 2018-01-06 NOTE — Telephone Encounter (Signed)
Returned call to Nicole Wilson after receipt of voicemail requesting a "call to arrange an appointment for Nicole Wilson.  She has C/O chest pain and difficulty breathing". Advised to go to the ED. "Started complaining yesterday.  Hard to define the shortness of breath.  She's on her I-Pad.  No visible signs or extra muscles used to breath.  No sweating.  Chest pain is between her breast and a little lower.  No back pain.  No changes in color or blue lips or nails.  We've not gone to the ED because lately she's having a lot of anxiety due to other health problems.  We have her close her eyes, lie back and the pain and c/o shortness of breath goes away."

## 2018-01-06 NOTE — ED Triage Notes (Signed)
Pt sent from [facility] for hypotension. Pt currently being treated with blood thinner for PE.

## 2018-01-06 NOTE — ED Notes (Signed)
Patient transported to CT 

## 2018-01-19 ENCOUNTER — Other Ambulatory Visit: Payer: Self-pay | Admitting: *Deleted

## 2018-01-19 DIAGNOSIS — I82402 Acute embolism and thrombosis of unspecified deep veins of left lower extremity: Secondary | ICD-10-CM

## 2018-01-19 MED ORDER — APIXABAN 5 MG PO TABS: 5.0000 mg | ORAL_TABLET | Freq: Two times a day (BID) | ORAL | 0 refills | Status: DC

## 2018-01-31 ENCOUNTER — Inpatient Hospital Stay: Payer: 59 | Attending: Nurse Practitioner | Admitting: Oncology

## 2018-01-31 ENCOUNTER — Telehealth: Payer: Self-pay | Admitting: Oncology

## 2018-01-31 VITALS — BP 113/62 | HR 59 | Temp 98.2°F | Resp 17 | Ht 59.0 in | Wt 143.1 lb

## 2018-01-31 DIAGNOSIS — I82402 Acute embolism and thrombosis of unspecified deep veins of left lower extremity: Secondary | ICD-10-CM

## 2018-01-31 DIAGNOSIS — I2699 Other pulmonary embolism without acute cor pulmonale: Secondary | ICD-10-CM | POA: Diagnosis not present

## 2018-01-31 DIAGNOSIS — Q909 Down syndrome, unspecified: Secondary | ICD-10-CM

## 2018-01-31 DIAGNOSIS — R202 Paresthesia of skin: Secondary | ICD-10-CM | POA: Diagnosis present

## 2018-01-31 DIAGNOSIS — M4802 Spinal stenosis, cervical region: Secondary | ICD-10-CM | POA: Diagnosis not present

## 2018-01-31 NOTE — Progress Notes (Signed)
  Nicole Wilson OFFICE PROGRESS NOTE   Diagnosis: Pulmonary embolism  INTERVAL HISTORY:     Nicole Wilson returns for a scheduled visit.Marland Kitchen  She was seen in the emergency room on 01/06/2018 for evaluation of presyncope.  A CT of the chest revealed resolution of previously noted pulmonary emboli.  She continues to have presyncope symptoms when she is anxious.  No syncope episodes.  Her chief complaint today is tingling and pain in the left arm and leg.  This has been present for months.  The leg tingling improved for approximately 2 weeks after an epidural steroid injection.  She continues Eliquis.  No bleeding.  Objective:  Vital signs in last 24 hours:  Blood pressure 113/62, pulse (!) 59, temperature 98.2 F (36.8 C), temperature source Oral, resp. rate 17, height 4\' 11"  (1.499 m), weight 143 lb 1.6 oz (64.9 kg), SpO2 100 %.    Resp: Lungs clear bilaterally Cardio: Regular rate and rhythm GI: No hepatosplenomegaly Vascular: Trace edema at the left leg below the knee. Neuro: The motor exam appears intact in the upper and lower extremities bilaterally with a slight decrease in grip and finger strength of the left hand     Lab Results:  Lab Results  Component Value Date   WBC 5.2 01/06/2018   HGB 15.1 (H) 01/06/2018   HCT 44.1 01/06/2018   MCV 94.0 01/06/2018   PLT 364 01/06/2018   NEUTROABS 8.0 (H) 10/19/2017    CMP     Component Value Date/Time   NA 141 01/06/2018 1854   K 4.4 01/06/2018 1854   CL 108 01/06/2018 1854   CO2 24 01/06/2018 1854   GLUCOSE 88 01/06/2018 1854   BUN 18 01/06/2018 1854   CREATININE 0.93 01/06/2018 1854   CALCIUM 8.9 01/06/2018 1854   PROT 6.7 01/06/2018 1854   ALBUMIN 3.5 01/06/2018 1854   AST 31 01/06/2018 1854   ALT 19 01/06/2018 1854   ALKPHOS 63 01/06/2018 1854   BILITOT 0.8 01/06/2018 1854   GFRNONAA >60 01/06/2018 1854   GFRAA >60 01/06/2018 1854     Medications: I have reviewed the patient's current  medications.   Assessment/Plan: 1. Extensive left lower extremity DVT and bilateral pulmonary embolism 10/19/2017 ? Presentation with a syncope event 10/18/2017 ? Left lower extremity Doppler 10/19/2017 confirmed extensive left lower deep and superficial venous thrombosis ? CT chest 10/20/2017 with bilateral central pulmonary emboli with right heart strain ? Left lower extremity thrombectomy procedure 10/20/2017 ? Heparin anticoagulation followed by apixaban ? CT 01/06/2018-resolution of pulmonary emboli  2.Down syndrome  3.History of bilateral carotid artery surgery for treatment ofmoyamoyadisease  4.Recurrent ear infections  5.Low back pain, status post lumbar fusion January 2017    Disposition: She appears stable.  She will continue indefinite anticoagulation therapy. The etiology of the left upper and lower extremity tingling and pain is unclear.  She has a history of lumbar fusion surgery and was noted to have cervical spondylosis and degenerative disc disease on an MRI in 2014.  We made a referral to neurology.  Nicole Wilson will return for an office visit in 6 months. 15 minutes were spent with the patient today.  The majority of the time was used for counseling and coordination of care.  Betsy Coder, MD  01/31/2018  4:01 PM

## 2018-01-31 NOTE — Telephone Encounter (Signed)
Appointments scheduled AVS/Calendar printed per 3/18 los °

## 2018-02-01 ENCOUNTER — Encounter: Payer: Self-pay | Admitting: Neurology

## 2018-02-03 ENCOUNTER — Ambulatory Visit: Payer: Self-pay | Admitting: Oncology

## 2018-02-15 DIAGNOSIS — Q909 Down syndrome, unspecified: Secondary | ICD-10-CM | POA: Diagnosis not present

## 2018-02-15 DIAGNOSIS — H903 Sensorineural hearing loss, bilateral: Secondary | ICD-10-CM | POA: Diagnosis not present

## 2018-02-15 DIAGNOSIS — H6983 Other specified disorders of Eustachian tube, bilateral: Secondary | ICD-10-CM | POA: Diagnosis not present

## 2018-02-15 DIAGNOSIS — H9313 Tinnitus, bilateral: Secondary | ICD-10-CM | POA: Diagnosis not present

## 2018-02-17 ENCOUNTER — Other Ambulatory Visit: Payer: Self-pay | Admitting: Oncology

## 2018-02-17 DIAGNOSIS — I82402 Acute embolism and thrombosis of unspecified deep veins of left lower extremity: Secondary | ICD-10-CM

## 2018-04-15 ENCOUNTER — Ambulatory Visit (INDEPENDENT_AMBULATORY_CARE_PROVIDER_SITE_OTHER): Payer: 59 | Admitting: Neurology

## 2018-04-15 ENCOUNTER — Encounter: Payer: Self-pay | Admitting: Neurology

## 2018-04-15 VITALS — BP 100/68 | HR 59 | Ht 59.0 in | Wt 147.2 lb

## 2018-04-15 DIAGNOSIS — R200 Anesthesia of skin: Secondary | ICD-10-CM | POA: Diagnosis not present

## 2018-04-15 DIAGNOSIS — R202 Paresthesia of skin: Secondary | ICD-10-CM

## 2018-04-15 DIAGNOSIS — M4802 Spinal stenosis, cervical region: Secondary | ICD-10-CM | POA: Diagnosis not present

## 2018-04-15 MED ORDER — GABAPENTIN 300 MG PO CAPS: ORAL_CAPSULE | ORAL | 5 refills | Status: DC

## 2018-04-15 NOTE — Progress Notes (Signed)
[facility] Clinic Note - Initial Visit   Date: 04/15/18  Nicole Wilson MRN: [id number] DOB: [birth year]   Dear Dr. Benay Spice:  Thank you for your kind referral of Nicole Wilson for consultation of left sided numbness/tingling. Although her history is well known to you, please allow [place] to reiterate it for the purpose of our medical record. The patient was accompanied to the clinic by parents who also provides collateral information.     History of Present Illness: Nicole Wilson is a 47 y.o. right-handed Caucasian female with Down Syndrome, hypothyroidism, LLE DVT and bilateral PE (10/2017), Moyamoya s/p EC-IC bypass surgery (2003), and low back pain s/p lumbar fusion (2017) presenting for evaluation of left sided tingling.    She has numbness/tingling of the left arm and leg for several years.  She denies any arm or leg weakness.  MRI cervical spine from 2016 shows cervical canal stenosis at multiple levels C3-4, C4-5 and C6-7.  This has been managed conservatively with physical therapy and trial of Lyrica (no benefit). She is also followed by Dr. Tivis Ringer, neurosurgeon at [facility], who did her lumbar fusion. In 2018, she had lumbar ESI which completely resolved her left sided paresthesias, but this only lasted for one week. She has constant tingling which is very bothersome.  She denies significant neck pain, but complains of "a knot" in her back.  She has also seen Dr. Sherwood Wilson for the same symptoms in 2018.   Her notes at [facility] say that she has left sided paresthesias from post-stroke when she had Moyamoya bypass, however her MRI brain from 2010 shows chronic ischemic changes involving the left cortical and subcortical region.  Out-side paper records, electronic medical record, and images have been reviewed where available and summarized as:  MRI cervical spine 10/08/2015: 1.Multilevel cervical spondylosis, with mild-to-moderate spinal canal stenosis at  C6-C7 and severe left and moderate right foraminal narrowing at C3-C4. 2.Abnormal angulation of C1 relative to C2, which appears unchanged from prior examinations. 3.Overall similar findings compared to prior examination from 03/30/2014.  MRI cervical spine 06/23/2013: 1.  Cervical spondylosis and degenerative disc disease with multiple mild subluxations contributing to marked impingement at C3-4 and C4-5, and moderate to prominent impingement at C6-7 as detailed above.  MRI brain wwo contrast 05/08/2009: Mild atrophy and small vessel disease. Evidence for remote left hemispheric infarct. Previous extracranial intracranial bypass on the left appears unremarkable. Minimal left greater than right mastoid fluid, but no significant enhancing or destructive temporal bone abnormality.   Past Medical History:  Diagnosis Date  . Allergic rhinitis   . Bradycardia   . Carotid artery occlusion    bilateral carotid artery bypass for Moya Moya  . Cervical spondylosis   . Down's syndrome   . Dyslipidemia   . Hidradenitis   . Hyperlipidemia   . Hypothyroidism   . MVP (mitral valve prolapse)    resolved on echo 2016  . Obesity (BMI 30-39.9) 06/01/2017  . Orthostatic hypotension     Past Surgical History:  Procedure Laterality Date  . carotid artery surgery    . IR THROMBECT VENO MECH MOD SED  10/20/2017  . IR US GUIDE VASC ACCESS LEFT  10/20/2017  . IR VENO/EXT/UNI LEFT  10/20/2017  . IR VENOCAVAGRAM IVC  10/20/2017  . MIDDLE EAR SURGERY    . TONSILLECTOMY AND ADENOIDECTOMY       Medications:  Outpatient Encounter Medications as of 04/15/2018  Medication Sig  . acetaminophen (TYLENOL  8 HOUR) 650 MG CR tablet Take 1 tablet by mouth 2 (two) times daily.  Marland Kitchen ELIQUIS 5 MG TABS tablet TAKE 1 TABLET BY MOUTH TWICE DAILY  . levothyroxine (SYNTHROID, LEVOTHROID) 100 MCG tablet Take 50 mcg by mouth daily.   Marland Kitchen gabapentin (NEURONTIN) 300 MG capsule Take 1 tablet at bedtime x 2 weeks, then increase  to 310m twice daily.   No facility-administered encounter medications on file as of 04/15/2018.      Allergies:  Allergies  Allergen Reactions  . Cephalexin Rash  . Dilantin [Phenytoin] Other (See Comments)    Pt doesnt remember    Family History: Family History  Problem Relation Age of Onset  . Multiple myeloma Father   . Prostate cancer Father   . Heart attack Neg Hx   . Stroke Neg Hx     Social History: Social History   Tobacco Use  . Smoking status: Never Smoker  . Smokeless tobacco: Never Used  Substance Use Topics  . Alcohol use: No  . Drug use: No   Social History   Social History Narrative   Lives with mom and dad in a 2 story home.  Has 2 sisters.  Retired x 2 [employer]      Review of Systems:  CONSTITUTIONAL: No fevers, chills, night sweats, or weight loss.   EYES: No visual changes or eye pain ENT: No hearing changes.  No history of nose bleeds.   RESPIRATORY: No cough, wheezing and shortness of breath.   CARDIOVASCULAR: Negative for chest pain, and palpitations.   GI: Negative for abdominal discomfort, blood in stools or black stools.  No recent change in bowel habits.   GU:  No history of incontinence.   MUSCLOSKELETAL: No history of joint pain or swelling.  No myalgias.   SKIN: Negative for lesions, rash, and itching.   HEMATOLOGY/ONCOLOGY: Negative for prolonged bleeding, bruising easily, and swollen nodes.  No history of cancer.   ENDOCRINE: Negative for cold or heat intolerance, polydipsia or goiter.   PSYCH:  No depression or anxiety symptoms.   NEURO: As Above.   Vital Signs:  BP 100/68   Pulse (!) 59   Ht 4' 11" (1.499 m)   Wt 147 lb 4 oz (66.8 kg)   SpO2 98%   BMI 29.74 kg/m   General Medical Exam:   General:  Well appearing, comfortable. Classic Down's facies.   Eyes/ENT: see cranial nerve examination.   Neck: No masses appreciated.  Full range of motion without tenderness.  No carotid bruits. Respiratory:   Clear to auscultation, good air entry bilaterally.   Cardiac:  Regular rate and rhythm, no murmur.   Extremities:  No deformities, edema, or skin discoloration.  Skin:  No rashes or lesions.  Neurological Exam: MENTAL STATUS including orientation to time, place, person, recent and remote memory, attention span and concentration, language, and fund of knowledge is fairly intact.  Speech is not dysarthric.   CRANIAL NERVES: II:  No visual field defects.  Unremarkable fundi.   III-IV-VI: Pupils equal round and reactive to light.  Esotropia bilaterally.  Normal conjugate, extra-ocular eye movements in all directions of gaze.  No nystagmus.  No ptosis V:  Normal facial sensation.   VII:  Normal facial symmetry and movements.  VIII:  Normal hearing and vestibular function.   IX-X:  Normal palatal movement.   XI:  Normal shoulder shrug and head rotation.   XII:  Normal tongue strength and range  of motion, no deviation or fasciculation.  MOTOR:  No atrophy, fasciculations or abnormal movements.  No pronator drift.  Tone is normal.    Right Upper Extremity:    Left Upper Extremity:    Deltoid  5/5   Deltoid  5/5   Biceps  5/5   Biceps  5/5   Triceps  5/5   Triceps  5/5   Wrist extensors  5/5   Wrist extensors  5/5   Wrist flexors  5/5   Wrist flexors  5/5   Finger extensors  5/5   Finger extensors  5/5   Finger flexors  5/5   Finger flexors  5/5   Dorsal interossei  5/5   Dorsal interossei  5/5   Abductor pollicis  5/5   Abductor pollicis  5/5   Tone (Ashworth scale)  0  Tone (Ashworth scale)  0   Right Lower Extremity:    Left Lower Extremity:    Hip flexors  5/5   Hip flexors  5/5   Hip extensors  5/5   Hip extensors  5/5   Knee flexors  5/5   Knee flexors  5/5   Knee extensors  5/5   Knee extensors  5/5   Dorsiflexors  5/5   Dorsiflexors  5/5   Plantarflexors  5/5   Plantarflexors  5/5   Toe extensors  5/5   Toe extensors  5/5   Toe flexors  5/5   Toe flexors  5/5   Tone (Ashworth  scale)  0  Tone (Ashworth scale)  0   MSRs:  Right                                                                 Left brachioradialis 2+  brachioradialis 2+  biceps 2+  biceps 2+  triceps 2+  triceps 2+  patellar 2+  patellar 2+  ankle jerk 2+  ankle jerk 2+  Hoffman no  Hoffman no  plantar response down  plantar response down   SENSORY:  Pin prick, temperature, vibration, and light touch is diffusely reduced on the left side - arm and leg, as compared to the right.   COORDINATION/GAIT: Normal finger-to- nose-finger.  Intact rapid alternating movements bilaterally.   Gait mildly wide-based and stable, unassisted.   IMPRESSION: Left hemisensory paresthesias most likely due to multilevel cervical canal stenosis.  Notes have previously indicated paresthesias are post-stroke, however, upon viewing her MRI brain from 2010, she has very little white matter changes on the right hemisphere and nearly all of her chronic ischemic changes is scattered across the left cortical and subcortical region, which would manifest with right hemisensory changes, if she was symptomatic from this. Further, her MRI cervical spine which I can view in our system from 2014 shows marked cervical spondylosis and impingement at C3-4, C4-5, and C6-7 levels.  I will repeat MRI cervical spine to assess for structural changes and request that she follow-up with Dr. Couture, neurosurgeon at [facility].  In the meantime, I will start gabapentin 300mg at bedtime x 2 weeks, then titrate to 300mg twice daily.  Side effects discussed.  If she is not a surgical candidate, she management will be symptomatic and can titrate gabapentin, if tolerable.    Thank you   for allowing me to participate in patient's care.  If I can answer any additional questions, I would be pleased to do so.    Sincerely,    Donika K. Patel, DO  

## 2018-04-15 NOTE — Patient Instructions (Addendum)
Start gabapentin 300mg  at bedtime x 2 weeks, then increase to 300mg  twice daily  MRI cervical spine.  Please request CD images to take your your appointment at [facility]

## 2018-04-26 ENCOUNTER — Telehealth: Payer: Self-pay | Admitting: Neurology

## 2018-04-27 ENCOUNTER — Ambulatory Visit
Admission: RE | Admit: 2018-04-27 | Discharge: 2018-04-27 | Disposition: A | Discharge status: Home / Self Care | Payer: 59 | Source: Ambulatory Visit | Attending: Neurology | Admitting: Neurology

## 2018-04-27 DIAGNOSIS — M4802 Spinal stenosis, cervical region: Secondary | ICD-10-CM

## 2018-04-27 DIAGNOSIS — R202 Paresthesia of skin: Secondary | ICD-10-CM

## 2018-04-27 DIAGNOSIS — R200 Anesthesia of skin: Secondary | ICD-10-CM

## 2018-04-29 NOTE — Telephone Encounter (Signed)
Pt's father Rush Landmark calling for MRI results. Best call back # [phone].

## 2018-04-29 NOTE — Telephone Encounter (Signed)
Please advise 

## 2018-04-29 NOTE — Telephone Encounter (Signed)
Returned call to patient's father and discussed the results of MRI cervical spine.  There is severe left foraminal stenosis at C3-4 and moderate at C4-5, which can cause her proximal left arm pain/paresthesias.   I have asked them to share these images with her neurosurgeon at [facility] for recommendations as she may need ESI for pain relief.    In the meantime, she is getting some relief of pain at bedtime with gabapentin 300mg  at bedtime and will be increasing to 300mg  twice daily tomorrow.  All questions answered.  Donika K. Posey Pronto, DO

## 2018-06-03 ENCOUNTER — Ambulatory Visit: Payer: Self-pay | Admitting: Neurology

## 2018-06-03 ENCOUNTER — Encounter: Payer: Self-pay | Admitting: Neurology

## 2018-06-03 ENCOUNTER — Ambulatory Visit (INDEPENDENT_AMBULATORY_CARE_PROVIDER_SITE_OTHER): Payer: 59 | Admitting: Neurology

## 2018-06-03 ENCOUNTER — Encounter

## 2018-06-03 VITALS — BP 100/64 | HR 75 | Ht 59.0 in | Wt 148.1 lb

## 2018-06-03 DIAGNOSIS — M9981 Other biomechanical lesions of cervical region: Secondary | ICD-10-CM

## 2018-06-03 DIAGNOSIS — M4802 Spinal stenosis, cervical region: Secondary | ICD-10-CM

## 2018-06-03 DIAGNOSIS — M5412 Radiculopathy, cervical region: Secondary | ICD-10-CM | POA: Diagnosis not present

## 2018-06-03 MED ORDER — GABAPENTIN 600 MG PO TABS: 600.0000 mg | ORAL_TABLET | Freq: Two times a day (BID) | ORAL | 5 refills | Status: DC

## 2018-06-03 NOTE — Patient Instructions (Signed)
Gabapentin 300 mg tablets    Morning         Evening  Week 1 300mg                           600 mg  Week 2 600mg    600 mg  and continue   If you develop increased sleepiness, stay at the lower dose.

## 2018-06-03 NOTE — Progress Notes (Signed)
Follow-up Visit   Date: 06/03/18    Nicole Wilson MRN: [id number] DOB: [birth year]   Interim History: Nicole Wilson is a 47 y.o. right-handed Caucasian female with Down Syndrome, hypothyroidism, LLE DVT and bilateral PE (10/2017), Moyamoya s/p EC-IC bypass surgery (2003), and low back pain s/p lumbar fusion (2017) returning to the clinic for follow-up of left arm tingling.  The patient was accompanied to the clinic by parents who also provides collateral information.    History of present illness: She has numbness/tingling of the left arm and leg for several years.  She denies any arm or leg weakness.  MRI cervical spine from 2016 shows cervical canal stenosis at multiple levels C3-4, C4-5 and C6-7.  This has been managed conservatively with physical therapy and trial of Lyrica (no benefit). She is also followed by Dr. Tivis Ringer, neurosurgeon at [facility], who did her lumbar fusion. In 2018, she had lumbar ESI which completely resolved her left sided paresthesias, but this only lasted for one week. She has constant tingling which is very bothersome.  She denies significant neck pain, but complains of "a knot" in her back.  She has also seen Dr. Sherwood Gambler for the same symptoms in 2018.   Her notes at [facility] say that she has left sided paresthesias from post-stroke when she had Moyamoya bypass, however her MRI brain from 2010 shows chronic ischemic changes involving the left cortical and subcortical region, not on the right.  UPDATE 06/03/2018:  She is here for follow-up visit. She had MRI cervical spine in June which showed severe left foraminal stenosis at C3-4 and moderate at C4-5, which can cause her proximal left arm pain/paresthesias. No evidence of spinal canal stenosis.  Her left arm pain and numbness did respond some to gabapentin but is getting worse again.  She is very frustrated at the lack of pain relief.  No new weakness.  She is scheduled to see her neurosurgeon next week.     Medications:  Current Outpatient Medications on File Prior to Visit  Medication Sig Dispense Refill  . acetaminophen (TYLENOL 8 HOUR) 650 MG CR tablet Take 1 tablet by mouth 2 (two) times daily.    Marland Kitchen ELIQUIS 5 MG TABS tablet TAKE 1 TABLET BY MOUTH TWICE DAILY 60 tablet 11  . gabapentin (NEURONTIN) 300 MG capsule Take 1 tablet at bedtime x 2 weeks, then increase to 300mg  twice daily. 60 capsule 5  . levothyroxine (SYNTHROID, LEVOTHROID) 100 MCG tablet Take 50 mcg by mouth daily.      No current facility-administered medications on file prior to visit.     Allergies:  Allergies  Allergen Reactions  . Cephalexin Rash  . Dilantin [Phenytoin] Other (See Comments)    Pt doesnt remember    Review of Systems:  CONSTITUTIONAL: No fevers, chills, night sweats, or weight loss.  EYES: No visual changes or eye pain ENT: No hearing changes.  No history of nose bleeds.   RESPIRATORY: No cough, wheezing and shortness of breath.   CARDIOVASCULAR: Negative for chest pain, and palpitations.   GI: Negative for abdominal discomfort, blood in stools or black stools.  No recent change in bowel habits.   GU:  No history of incontinence.   MUSCLOSKELETAL: No history of joint pain or swelling.  No myalgias.   SKIN: Negative for lesions, rash, and itching.   ENDOCRINE: Negative for cold or heat intolerance, polydipsia or goiter.   PSYCH:  No depression or anxiety symptoms.  NEURO: As Above.   Vital Signs:  BP 100/64   Pulse 75   Ht 4\' 11"  (1.499 m)   Wt 148 lb 2 oz (67.2 kg)   SpO2 98%   BMI 29.92 kg/m    General:  Appears comfortable, expresses frustrating with her left arm pain, classic down facies Neck: No carotid bruit CV: Regular rate and rhythm Ext: No edema  Neurological Exam: MENTAL STATUS including orientation to time, place, person, recent and remote memory, attention span and concentration, language, and fund of knowledge is normal.  Speech is not dysarthric.  CRANIAL NERVES:   Pupils equal round and reactive to light. Bilateral esotropia.  Normal conjugate, extra-ocular eye movements in all directions of gaze.  No ptosis.  Face is symmetric. Palate elevates symmetrically.  Tongue is midline.  MOTOR:  Motor strength is 5/5 in all extremities.  No atrophy, fasciculations or abnormal movements.  No pronator drift.  Tone is normal.    MSRs:  Reflexes are 2+/4 throughout  SENSORY:  Reduced vibration over the left arm, greater temperature loss on the lateral side of the arm and shoulder.   COORDINATION/GAIT:  Normal finger-to- nose-finger and heel-to-shin.  Gait is mildly wide-based, stable  Data: MRI cervical spine wo contrast 04/28/2018: 1. Severe left C3-4 neural foraminal stenosis, unchanged. 2. Moderate left C4-5 foraminal stenosis, slightly progressed from the prior study. 3. Mild C6-7 spinal canal stenosis, unchanged.  IMPRESSION/PLAN: Chronic left arm paresthesia.  Imaging shows severe left foraminal stenosis at C3-4 and moderate at C4-5, which can cause her proximal left arm pain/paresthesias. Although it would not explain the diffuse nature of her pain, a trial of nerve block at this level may help differentiate the nature of her pain.  She is scheduled to follow-up with her neurosurgeon at [facility] next week.  I have asked her to review images with him and to consider ESI for pain relief.  She will need to see pain management for this.   In the meantime, titrate gabapentin to 600mg  twice daily (titration schedule provided)  Chronic left leg paresthesia, possibly due to residual nerve pain from her L4-5 canal stenosis s/p L4-5 PLIF (2017).   Notes have indicated this is post-stroke, however, upon viewing her MRI brain from 2010, she has very little white matter changes on the right hemisphere and nearly all of her chronic ischemic changes is scattered across the left cortical and subcortical region, which would manifest with right hemisensory changes, if she was  symptomatic from this.  At this juncture, management is symptomatic.     Thank you for allowing me to participate in patient's care.  If I can answer any additional questions, I would be pleased to do so.    Sincerely,    Saliah Crisp K. Posey Pronto, DO

## 2018-06-17 ENCOUNTER — Ambulatory Visit: Payer: Self-pay | Admitting: Cardiology

## 2018-06-27 ENCOUNTER — Other Ambulatory Visit: Payer: Self-pay | Admitting: Oncology

## 2018-06-27 ENCOUNTER — Telehealth: Payer: Self-pay | Admitting: Oncology

## 2018-06-27 NOTE — Telephone Encounter (Signed)
Appts scheduled AVS/Calendar printed per 8/12 los °

## 2018-08-02 ENCOUNTER — Ambulatory Visit: Payer: Self-pay | Admitting: Nurse Practitioner

## 2018-08-28 ENCOUNTER — Encounter: Payer: Self-pay | Admitting: Cardiology

## 2018-08-28 NOTE — Progress Notes (Signed)
Cardiology Office Note:    Date:  08/29/2018   ID:  Nicole Wilson, DOB 21-Dec-1970, MRN [id number]  PCP:  Aretta Nip, MD  Cardiologist:  No primary care provider on file.    Referring MD: Aretta Nip, MD   Chief Complaint  Patient presents with  . Follow-up    orthostatic hypotension, bradycardia    History of Present Illness:    Nicole Wilson is a 47 y.o. female with a hx of Down's Syndrome with Moya Moya, bradycardia, Orthostatic hypotension, chronic chest wall pain,LLE DVT and bilateral PEs and MVP . She is here today for followup and is doing well.  She denies any  PND, orthopnea, LE edema, dizziness or syncope.  Her dad says that she has been having more episodes of dizziness than in the past.  She is complaining more of midsternal chest pain that is a pressure with no radiation but is associated with SOB.  The pain is nonexertional and somewhat worse with palpation over her sternum.  She says that she has been having more problems with SOB and says that when she gets the pain her SOB is worse.   She is compliant with her meds and is tolerating meds with no SE.    Past Medical History:  Diagnosis Date  . Allergic rhinitis   . Bradycardia   . Carotid artery occlusion    bilateral carotid artery bypass for Moya Moya  . Cervical spondylosis   . Down's syndrome   . Dyslipidemia   . Hidradenitis   . Hyperlipidemia   . Hypothyroidism   . MVP (mitral valve prolapse)    resolved on echo 2016  . Obesity (BMI 30-39.9) 06/01/2017  . Orthostatic hypotension   . Pulmonary emboli ([place]) 10/2017    Past Surgical History:  Procedure Laterality Date  . carotid artery surgery    . IR THROMBECT VENO MECH MOD SED  10/20/2017  . IR US GUIDE VASC ACCESS LEFT  10/20/2017  . IR VENO/EXT/UNI LEFT  10/20/2017  . IR VENOCAVAGRAM IVC  10/20/2017  . MIDDLE EAR SURGERY    . TONSILLECTOMY AND ADENOIDECTOMY      Current Medications: Current Meds  Medication Sig  . acetaminophen  (TYLENOL 8 HOUR) 650 MG CR tablet Take 1 tablet by mouth 2 (two) times daily as needed.   Marland Kitchen aspirin EC 81 MG tablet Take 81 mg by mouth daily.  Marland Kitchen ELIQUIS 5 MG TABS tablet TAKE 1 TABLET BY MOUTH TWICE DAILY  . gabapentin (NEURONTIN) 300 MG capsule Take 1 tablet at bedtime x 2 weeks, then increase to 368m twice daily.  .Marland Kitchengabapentin (NEURONTIN) 600 MG tablet Take 1 tablet (600 mg total) by mouth 2 (two) times daily.  .Marland Kitchenlevothyroxine (SYNTHROID, LEVOTHROID) 100 MCG tablet Take 50 mcg by mouth daily.      Allergies:   Cephalexin and Dilantin [phenytoin]   Social History   Socioeconomic History  . Marital status: Single    Spouse name: Not on file  . Number of children: Not on file  . Years of education: Not on file  . Highest education level: Not on file  Occupational History  . Not on file  Social Needs  . Financial resource strain: Not on file  . Food insecurity:    Worry: Not on file    Inability: Not on file  . Transportation needs:    Medical: Not on file    Non-medical: Not on file  Tobacco Use  .  Smoking status: Never Smoker  . Smokeless tobacco: Never Used  Substance and Sexual Activity  . Alcohol use: No  . Drug use: No  . Sexual activity: Not on file  Lifestyle  . Physical activity:    Days per week: Not on file    Minutes per session: Not on file  . Stress: Not on file  Relationships  . Social connections:    Talks on phone: Not on file    Gets together: Not on file    Attends religious service: Not on file    Active member of club or organization: Not on file    Attends meetings of clubs or organizations: Not on file    Relationship status: Not on file  Other Topics Concern  . Not on file  Social History Narrative   Lives with mom and dad in a 2 story home.  Has 2 sisters.  Retired x 2 from [employer] at [place].       Family History: The patient's family history includes Multiple myeloma in her father; Prostate cancer in her father. There is no  history of Heart attack or Stroke.  ROS:   Please see the history of present illness.    ROS  All other systems reviewed and negative.   EKGs/Labs/Other Studies Reviewed:    The following studies were reviewed today: none  EKG:  EKG is not ordered today.    Recent Labs: 01/06/2018: ALT 19; B Natriuretic Peptide 15.5; BUN 18; Creatinine, Ser 0.93; Hemoglobin 15.1; Platelets 364; Potassium 4.4; Sodium 141   Recent Lipid Panel No results found for: CHOL, TRIG, HDL, CHOLHDL, VLDL, LDLCALC, LDLDIRECT  Physical Exam:    VS:  BP 90/60   Pulse (!) 59   Ht '4\' 11"'  (1.499 m)   Wt 152 lb (68.9 kg)   SpO2 99%   BMI 30.70 kg/m    o Orthostatic VS for the past 24 hrs:  BP- Lying Pulse- Lying BP- Sitting Pulse- Sitting BP- Standing at 0 minutes Pulse- Standing at 0 minutes  08/29/18 0840 105/65 (!) 47 (!) 89/63 51 (!) 84/58 70     Wt Readings from Last 3 Encounters:  08/29/18 152 lb (68.9 kg)  06/03/18 148 lb 2 oz (67.2 kg)  04/15/18 147 lb 4 oz (66.8 kg)     GEN:  Well nourished, well developed in no acute distress HEENT: Normal NECK: No JVD; No carotid bruits LYMPHATICS: No lymphadenopathy CARDIAC: RRR, no murmurs, rubs, gallops RESPIRATORY:  Clear to auscultation without rales, wheezing or rhonchi  ABDOMEN: Soft, non-tender, non-distended MUSCULOSKELETAL:  No edema; No deformity  SKIN: Warm and dry NEUROLOGIC:  Alert and oriented x 3 PSYCHIATRIC:  Normal affect   ASSESSMENT:    1. MVP (mitral valve prolapse)   2. Orthostatic hypotension   3. Bradycardia   4. Acute pulmonary embolism with acute cor pulmonale, unspecified pulmonary embolism type (HCC)   5. Chest pain, unspecified type    PLAN:    In order of problems listed above:  1.   MVP - no evidence of MVP or MR on echo 10/2017.  2.  Orthostatic hypotension - she has been having more dizzy spells than usual and is orthostatic on exam today.  I have given her a Rx for compression hose and encouraged her to  liberalize her salt intake and drink at least 64oz of fluids daily including gatorade. .  3.  Bradycardia - she is asymptomatic   4.  History of DVT/PE -  echo 10/2017 showed acute RV pressure overload - will repeat echo to make sure this has resolved. She is on Eliquis.   5.  Chest pain - this is atypical and she has had it in the past but seems to be occurring more frequently now and associated with SOB.  I suspect it is MSK in origin but with h/o PE , I will check a DDimer to rule out reoccurrence of PE. I will also get an ETT and echo.     Medication Adjustments/Labs and Tests Ordered: Current medicines are reviewed at length with the patient today.  Concerns regarding medicines are outlined above.  No orders of the defined types were placed in this encounter.  No orders of the defined types were placed in this encounter.   Signed, Fransico Him, MD  08/29/2018 8:50 AM    [facility]

## 2018-08-29 ENCOUNTER — Ambulatory Visit (INDEPENDENT_AMBULATORY_CARE_PROVIDER_SITE_OTHER): Payer: 59 | Admitting: Cardiology

## 2018-08-29 ENCOUNTER — Encounter (INDEPENDENT_AMBULATORY_CARE_PROVIDER_SITE_OTHER): Payer: Self-pay

## 2018-08-29 ENCOUNTER — Other Ambulatory Visit: Payer: Self-pay

## 2018-08-29 ENCOUNTER — Emergency Department (HOSPITAL_COMMUNITY): Payer: 59

## 2018-08-29 ENCOUNTER — Encounter: Payer: Self-pay | Admitting: Cardiology

## 2018-08-29 ENCOUNTER — Telehealth: Payer: Self-pay

## 2018-08-29 ENCOUNTER — Encounter (HOSPITAL_COMMUNITY): Payer: Self-pay | Admitting: *Deleted

## 2018-08-29 ENCOUNTER — Emergency Department (HOSPITAL_COMMUNITY)
Admission: EM | Admit: 2018-08-29 | Discharge: 2018-08-29 | Disposition: A | Discharge status: Home / Self Care | Payer: 59 | Attending: Emergency Medicine | Admitting: Emergency Medicine

## 2018-08-29 VITALS — BP 90/60 | HR 59 | Ht 59.0 in | Wt 152.0 lb

## 2018-08-29 DIAGNOSIS — R7989 Other specified abnormal findings of blood chemistry: Secondary | ICD-10-CM | POA: Insufficient documentation

## 2018-08-29 DIAGNOSIS — I951 Orthostatic hypotension: Secondary | ICD-10-CM

## 2018-08-29 DIAGNOSIS — E039 Hypothyroidism, unspecified: Secondary | ICD-10-CM | POA: Diagnosis not present

## 2018-08-29 DIAGNOSIS — I341 Nonrheumatic mitral (valve) prolapse: Secondary | ICD-10-CM | POA: Diagnosis not present

## 2018-08-29 DIAGNOSIS — Z7982 Long term (current) use of aspirin: Secondary | ICD-10-CM | POA: Diagnosis not present

## 2018-08-29 DIAGNOSIS — Z86718 Personal history of other venous thrombosis and embolism: Secondary | ICD-10-CM | POA: Insufficient documentation

## 2018-08-29 DIAGNOSIS — Z7901 Long term (current) use of anticoagulants: Secondary | ICD-10-CM | POA: Diagnosis not present

## 2018-08-29 DIAGNOSIS — R001 Bradycardia, unspecified: Secondary | ICD-10-CM

## 2018-08-29 DIAGNOSIS — R899 Unspecified abnormal finding in specimens from other organs, systems and tissues: Secondary | ICD-10-CM

## 2018-08-29 DIAGNOSIS — E785 Hyperlipidemia, unspecified: Secondary | ICD-10-CM | POA: Insufficient documentation

## 2018-08-29 DIAGNOSIS — I2609 Other pulmonary embolism with acute cor pulmonale: Secondary | ICD-10-CM

## 2018-08-29 DIAGNOSIS — R079 Chest pain, unspecified: Secondary | ICD-10-CM | POA: Diagnosis not present

## 2018-08-29 DIAGNOSIS — Z79899 Other long term (current) drug therapy: Secondary | ICD-10-CM | POA: Insufficient documentation

## 2018-08-29 LAB — COMPREHENSIVE METABOLIC PANEL
ALBUMIN: 3.2 g/dL — AB (ref 3.5–5.0)
ALK PHOS: 61 U/L (ref 38–126)
ALT: 15 U/L (ref 0–44)
AST: 24 U/L (ref 15–41)
Anion gap: 7 (ref 5–15)
BILIRUBIN TOTAL: 0.2 mg/dL — AB (ref 0.3–1.2)
BUN: 11 mg/dL (ref 6–20)
CO2: 24 mmol/L (ref 22–32)
Calcium: 8.7 mg/dL — ABNORMAL LOW (ref 8.9–10.3)
Chloride: 108 mmol/L (ref 98–111)
Creatinine, Ser: 0.87 mg/dL (ref 0.44–1.00)
GFR calc Af Amer: >60 mL/min (ref >60)
GFR calc non Af Amer: >60 mL/min (ref >60)
GLUCOSE: 90 mg/dL (ref 70–99)
POTASSIUM: 3.7 mmol/L (ref 3.5–5.1)
SODIUM: 139 mmol/L (ref 135–145)
TOTAL PROTEIN: 6.9 g/dL (ref 6.5–8.1)

## 2018-08-29 LAB — CBC WITH DIFFERENTIAL/PLATELET
Abs Immature Granulocytes: 0.02 10*3/uL (ref 0.00–0.07)
BASOS ABS: 0.1 10*3/uL (ref 0.0–0.1)
Basophils Relative: 1 %
Eosinophils Absolute: 0.1 10*3/uL (ref 0.0–0.5)
Eosinophils Relative: 1 %
HEMATOCRIT: 41 % (ref 36.0–46.0)
HEMOGLOBIN: 13 g/dL (ref 12.0–15.0)
IMMATURE GRANULOCYTES: 0 %
LYMPHS ABS: 1 10*3/uL (ref 0.7–4.0)
Lymphocytes Relative: 16 %
MCH: 31.2 pg (ref 26.0–34.0)
MCHC: 31.7 g/dL (ref 30.0–36.0)
MCV: 98.3 fL (ref 80.0–100.0)
Monocytes Absolute: 0.5 10*3/uL (ref 0.1–1.0)
Monocytes Relative: 8 %
NRBC: 0 % (ref 0.0–0.2)
Neutro Abs: 4.3 10*3/uL (ref 1.7–7.7)
Neutrophils Relative %: 74 %
Platelets: 295 10*3/uL (ref 150–400)
RBC: 4.17 MIL/uL (ref 3.87–5.11)
RDW: 14.1 % (ref 11.5–15.5)
WBC: 5.9 10*3/uL (ref 4.0–10.5)

## 2018-08-29 LAB — D-DIMER, QUANTITATIVE: D-DIMER: 1.41 mg/L FEU — ABNORMAL HIGH (ref 0.00–0.49)

## 2018-08-29 LAB — PROTIME-INR
INR: 1.22
PROTHROMBIN TIME: 15.3 s — AB (ref 11.4–15.2)

## 2018-08-29 MED ORDER — IOPAMIDOL (ISOVUE-370) INJECTION 76%
100.0000 mL | Freq: Once | INTRAVENOUS | Status: AC | PRN
  Administered 2018-08-29: 100 mL via INTRAVENOUS

## 2018-08-29 MED ORDER — IOPAMIDOL (ISOVUE-370) INJECTION 76%
INTRAVENOUS | Status: AC
  Filled 2018-08-29: qty 100

## 2018-08-29 NOTE — ED Triage Notes (Signed)
Pt in with elevated d-dimer from MD office, pt went there due to shortness of breath, states it feels tight when she takes a deep breath, extensive history of blood clots, no distress noted

## 2018-08-29 NOTE — ED Provider Notes (Signed)
Patient placed in Quick Look pathway, seen and evaluated   Chief Complaint: SOB and chest pain  HPI:   Pt sent from PCP for elevated d-dimer of 1.41 today. Chest pains and SOB for 1.5 weeks. Positive hx DVT and PE, on eliquis. No missed doses of eliquis.NO new pain/swelling in legs, no hemoptysis. Aug 28 surgery on c-spine.   ROS: + SOB, + chest pain  Physical Exam:   Gen: No distress  Neuro: Awake and Alert  Skin: Warm    Focused Exam: normal work of breathing. Lungs CTAB. Heart sounds normal. Some swelling to left lower leg near ankle (reportedly chronic) - nontender.   Initiation of care has begun. The patient has been counseled on the process, plan, and necessity for staying for the completion/evaluation, and the remainder of the medical screening examination    Gustie Bobb, [place] N, PA-C 08/29/18 1545    Lajean Saver, MD 08/29/18 [phone]

## 2018-08-29 NOTE — ED Provider Notes (Signed)
[facility] EMERGENCY DEPARTMENT Provider Note   CSN: [id number] Arrival date & time: 08/29/18  1440     History   Chief Complaint Chief Complaint  Patient presents with  . Abnormal Lab    HPI Nicole Wilson is a 47 y.o. female.  Patient with past medical history of Down syndrome and prior PE presents to the emergency department with a chief complaint of abnormal lab.  She was sent over by her cardiologist, Dr. Radford Pax, due to elevated d-dimer.  Patient was seen in routine follow-up today by Dr. Radford Pax, and had d-dimer checked due to complaint of some chest pain that she had been having for the past few days.  Patient does have a history of blood clots, and is anticoagulated on Eliquis.  She has been compliant in taking her Eliquis.  Family member states that she also has some anxiety, and believe this may also be contributing to her symptoms.  She denies any fever, chills, or cough.  Denies any other associated symptoms.    The history is provided by the patient. No language interpreter was used.    Past Medical History:  Diagnosis Date  . Allergic rhinitis   . Bradycardia   . Carotid artery occlusion    bilateral carotid artery bypass for Moya Moya  . Cervical spondylosis   . Down's syndrome   . Dyslipidemia   . Hidradenitis   . Hyperlipidemia   . Hypothyroidism   . MVP (mitral valve prolapse)    resolved on echo 2016  . Obesity (BMI 30-39.9) 06/01/2017  . Orthostatic hypotension   . Pulmonary emboli ([place]) 10/2017    Patient Active Problem List   Diagnosis Date Noted  . Chest pain 08/29/2018  . Left leg DVT ([place]) 10/20/2017  . Pulmonary embolism with acute cor pulmonale ([place]) 10/20/2017  . 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 Pulmonary emboli ([place]) 10/16/2017  . Obesity (BMI 30-39.9) 06/01/2017  . HLD (hyperlipidemia) 11/25/2015  . Spondylolisthesis of lumbosacral region 11/25/2015  . Cervical stenosis of spinal  canal 03/16/2014  . Chronic chest wall pain 10/06/2013  . Bradycardia   . Carotid artery occlusion   . MVP (mitral valve prolapse)   . Hypothyroidism   . Orthostatic hypotension   . Down syndrome   . Hidradenitis   . Dyslipidemia   . Allergic rhinitis   . Moyamoya disease 03/03/2012    Past Surgical History:  Procedure Laterality Date  . carotid artery surgery    . IR THROMBECT VENO MECH MOD SED  10/20/2017  . IR US GUIDE VASC ACCESS LEFT  10/20/2017  . IR VENO/EXT/UNI LEFT  10/20/2017  . IR VENOCAVAGRAM IVC  10/20/2017  . MIDDLE EAR SURGERY    . TONSILLECTOMY AND ADENOIDECTOMY       OB History   None      Home Medications    Prior to Admission medications   Medication Sig Start Date End Date Taking? Authorizing Provider  acetaminophen (TYLENOL 8 HOUR) 650 MG CR tablet Take 1 tablet by mouth 2 (two) times daily as needed.     [provider]  aspirin EC 81 MG tablet Take 81 mg by mouth daily.    [provider]  ELIQUIS 5 MG TABS tablet TAKE 1 TABLET BY MOUTH TWICE DAILY 02/17/18   Ladell Pier, MD  gabapentin (NEURONTIN) 300 MG capsule Take 1 tablet at bedtime x 2 weeks, then increase to 350m twice daily. 04/15/18  Patel, Donika K, DO  gabapentin (NEURONTIN) 600 MG tablet Take 1 tablet (600 mg total) by mouth 2 (two) times daily. 06/03/18   Narda Amber K, DO  levothyroxine (SYNTHROID, LEVOTHROID) 100 MCG tablet Take 50 mcg by mouth daily.  07/19/13   [provider]    Family History Family History  Problem Relation Age of Onset  . Multiple myeloma Father   . Prostate cancer Father   . Heart attack Neg Hx   . Stroke Neg Hx     Social History Social History   Tobacco Use  . Smoking status: Never Smoker  . Smokeless tobacco: Never Used  Substance Use Topics  . Alcohol use: No  . Drug use: No     Allergies   Cephalexin and Dilantin [phenytoin]   Review of Systems Review of Systems  All other systems reviewed and are  negative.    Physical Exam Updated Vital Signs BP (!) 94/52 (BP Location: Right Arm)   Pulse (!) 104   Temp 98.4 F (36.9 C) (Oral)   Resp 18   SpO2 100%   Physical Exam  Constitutional: She is oriented to person, place, and time. She appears well-developed and well-nourished.  HENT:  Head: Normocephalic and atraumatic.  Eyes: Pupils are equal, round, and reactive to light. Conjunctivae and EOM are normal.  Neck: Normal range of motion. Neck supple.  Cardiovascular: Normal rate and regular rhythm. Exam reveals no gallop and no friction rub.  No murmur heard. Pulmonary/Chest: Effort normal and breath sounds normal. No respiratory distress. She has no wheezes. She has no rales. She exhibits no tenderness.  Abdominal: Soft. Bowel sounds are normal. She exhibits no distension and no mass. There is no tenderness. There is no rebound and no guarding.  Musculoskeletal: Normal range of motion. She exhibits no edema or tenderness.  Neurological: She is alert and oriented to person, place, and time.  Skin: Skin is warm and dry.  Psychiatric: She has a normal mood and affect. Her behavior is normal. Judgment and thought content normal.  Nursing note and vitals reviewed.    ED Treatments / Results  Labs (all labs ordered are listed, but only abnormal results are displayed) Labs Reviewed  COMPREHENSIVE METABOLIC PANEL - Abnormal; Notable for the following components:      Result Value   Calcium 8.7 (*)    Albumin 3.2 (*)    Total Bilirubin 0.2 (*)    All other components within normal limits  PROTIME-INR - Abnormal; Notable for the following components:   Prothrombin Time 15.3 (*)    All other components within normal limits  CBC WITH DIFFERENTIAL/PLATELET    EKG None  Radiology Ct Angio Chest Pe W/cm &/or Wo Cm  Result Date: 08/29/2018 CLINICAL DATA:  47 year old with orthostatic hypotension and chronic chest pain. High pretest probability for pulmonary embolism. History of  pulmonary emboli. EXAM: CT ANGIOGRAPHY CHEST WITH CONTRAST TECHNIQUE: Multidetector CT imaging of the chest was performed using the standard protocol during bolus administration of intravenous contrast. Multiplanar CT image reconstructions and MIPs were obtained to evaluate the vascular anatomy. CONTRAST:  148m ISOVUE-370 IOPAMIDOL (ISOVUE-370) INJECTION 76% COMPARISON:  01/06/2018 and 10/20/2017 FINDINGS: Cardiovascular: Negative for an acute pulmonary embolism. Linear density in the right lower lobe pulmonary artery segmental branches on sequence 7, image 174 appears to be chronic and represent sequelae of prior pulmonary embolism. Normal caliber of the thoracic aorta. Heart size is slightly prominent for size but no significant pericardial fluid. Mediastinum/Nodes: No  significant chest lymphadenopathy. Thyroid tissue is asymmetric, right side greater than left. Lungs/Pleura: Trachea and mainstem bronchi are patent. No large pleural effusions. No significant airspace disease or lung consolidation. Negative for a pneumothorax. Upper Abdomen: Images of the upper abdomen are unremarkable. Musculoskeletal: No acute bone abnormality. Significant degenerative changes in lower cervical spine. Review of the MIP images confirms the above findings. IMPRESSION: 1. Negative for an acute pulmonary embolism. Few small bands or synechiae in right lower lobe pulmonary arteries appear chronic and related to old thrombus. 2. No acute chest abnormality. Electronically Signed   By: Markus Daft M.D.   On: 08/29/2018 18:00    Procedures Procedures (including critical care time)  Medications Ordered in ED Medications  iopamidol (ISOVUE-370) 76 % injection (has no administration in time range)  iopamidol (ISOVUE-370) 76 % injection 100 mL (100 mLs Intravenous Contrast Given 08/29/18 1724)     Initial Impression / Assessment and Plan / ED Course  I have reviewed the triage vital signs and the nursing notes.  Pertinent labs  & imaging results that were available during my care of the patient were reviewed by me and considered in my medical decision making (see chart for details).     Patient sent to the emergency department for elevated d-dimer.  CT scan ordered in triage shows no evidence of pulmonary embolism.  Laboratory work-up is reassuring.    Patient does have some baseline orthostatic hypotension.  This was discussed at her cardiologist appointment today, she was encouraged to continue drinking plenty of fluids and to increase her salt intake.  Parents state the patient struggles to drink more than 16 ounces of water per day.  Given reassuring CT chest, and appropriate anticoagulation, feel that patient is stable for outpatient follow-up.  She understands and agrees the plan.  Parents understand and agree with plan.  Final Clinical Impressions(s) / ED Diagnoses   Final diagnoses:  Abnormal laboratory test  Chest pain, unspecified type    ED Discharge Orders    None       Montine Circle, PA-C 08/29/18 2309    Fredia Sorrow, MD 09/05/18 [phone]

## 2018-08-29 NOTE — Telephone Encounter (Signed)
-----   Message from Sueanne Margarita, MD sent at 08/29/2018  1:48 PM EDT ----- Elevated ddimer - patient needs Chest CT angio today to rule out PE

## 2018-08-29 NOTE — ED Notes (Signed)
E-signature not available, pt verbalized understanding of DC instructions  

## 2018-08-29 NOTE — Telephone Encounter (Signed)
Spoke with the patient's father, we need her to go to the ED to have a chest CT to rule out PE. The patient has elevated D-dimer. The father accepted and is going to [facility].

## 2018-08-29 NOTE — Patient Instructions (Signed)
Medication Instructions:  Your physician recommends that you continue on your current medications as directed. Please refer to the Current Medication list given to you today.  If you need a refill on your cardiac medications before your next appointment, please call your pharmacy.   Lab work: Today: D-Dimer   If you have labs (blood work) drawn today and your tests are completely normal, you will receive your results only by: Marland Kitchen MyChart Message (if you have MyChart) OR . A paper copy in the mail If you have any lab test that is abnormal or we need to change your treatment, we will call you to review the results.  Testing/Procedures: Your physician has requested that you have an echocardiogram. Echocardiography is a painless test that uses sound waves to create images of your heart. It provides your doctor with information about the size and shape of your heart and how well your heart's chambers and valves are working. This procedure takes approximately one hour. There are no restrictions for this procedure.  Your physician has requested that you have an exercise tolerance test. For further information please visit [email or web address]. Please also follow instruction sheet, as given.  Follow-Up: At [facility], you and your health needs are our priority.  As part of our continuing mission to provide you with exceptional heart care, we have created designated Provider Care Teams.  These Care Teams include your primary Cardiologist (physician) and Advanced Practice Providers (APPs -  Physician Assistants and Nurse Practitioners) who all work together to provide you with the care you need, when you need it. You will need a follow up appointment in 1 years.  Please call our office 2 months in advance to schedule this appointment.  You may see Dr. Radford Pax or one of the following Advanced Practice Providers on your designated Care Team:   [place], PA-C Melina Copa, PA-C . Ermalinda Barrios,  PA-C

## 2018-08-29 NOTE — ED Notes (Signed)
Unable to do vital pt in CT

## 2018-08-29 NOTE — ED Notes (Signed)
CT notified pt is ready for scan 

## 2018-08-30 ENCOUNTER — Ambulatory Visit (HOSPITAL_COMMUNITY): Payer: 59

## 2018-09-05 ENCOUNTER — Telehealth: Payer: Self-pay | Admitting: Nurse Practitioner

## 2018-09-05 ENCOUNTER — Inpatient Hospital Stay: Payer: 59 | Attending: Nurse Practitioner | Admitting: Nurse Practitioner

## 2018-09-05 ENCOUNTER — Encounter: Payer: Self-pay | Admitting: Nurse Practitioner

## 2018-09-05 VITALS — BP 104/78 | HR 65 | Temp 98.4°F | Resp 18 | Ht 59.0 in | Wt 148.5 lb

## 2018-09-05 DIAGNOSIS — I82402 Acute embolism and thrombosis of unspecified deep veins of left lower extremity: Secondary | ICD-10-CM

## 2018-09-05 DIAGNOSIS — Z86711 Personal history of pulmonary embolism: Secondary | ICD-10-CM | POA: Insufficient documentation

## 2018-09-05 DIAGNOSIS — Q909 Down syndrome, unspecified: Secondary | ICD-10-CM | POA: Diagnosis not present

## 2018-09-05 DIAGNOSIS — Z7901 Long term (current) use of anticoagulants: Secondary | ICD-10-CM | POA: Insufficient documentation

## 2018-09-05 NOTE — Telephone Encounter (Signed)
Scheduled appt per 10/21 los- gave patient AVS and calender per los.   

## 2018-09-05 NOTE — Progress Notes (Signed)
  [facility] OFFICE PROGRESS NOTE   Diagnosis: Pulmonary embolism  INTERVAL HISTORY:   Nicole Wilson returns as scheduled.  He continues Eliquis.  She was seen in the emergency department last week due to an elevated d-dimer and chest pain.  CT scan was negative for an acute pulmonary embolism.  She continues to have intermittent chest pain.  Her father reports she is scheduled for additional evaluation later this week including a stress test and echocardiogram.  She denies shortness of breath.  No bleeding.  She is intermittently anxious.  Recently she has had sinus drainage and intermittently has a "choking sensation" related to this.  Objective:  Vital signs in last 24 hours:  Blood pressure 104/78, pulse 65, temperature 98.4 F (36.9 C), temperature source Oral, resp. rate 18, height 4\' 11"  (1.499 m), weight 148 lb 8 oz (67.4 kg), SpO2 100 %.    HEENT: No thrush or ulcers. Lymphatics: No palpable cervical or supraclavicular lymph nodes. Resp: Lungs clear bilaterally. Cardio: Regular rate and rhythm. GI: Abdomen soft and nontender.  No hepatosplenomegaly. Vascular: No leg edema.   Lab Results:  Lab Results  Component Value Date   WBC 5.9 08/29/2018   HGB 13.0 08/29/2018   HCT 41.0 08/29/2018   MCV 98.3 08/29/2018   PLT 295 08/29/2018   NEUTROABS 4.3 08/29/2018    Imaging:  No results found.  Medications: I have reviewed the patient's current medications.  Assessment/Plan: 1. Extensive left lower extremity DVT and bilateral pulmonary embolism12/02/2017 ? Presentation with a syncope event 10/18/2017 ? Left lower extremity Doppler 10/19/2017 confirmed extensive left lower deep and superficial venous thrombosis ? CT chest 10/20/2017 with bilateral central pulmonary emboli with right heart strain ? Left lower extremity thrombectomy procedure 10/20/2017 ? Heparin anticoagulation followed by apixaban ? CT 01/06/2018-resolution of pulmonary emboli ? CT  09/05/2018-negative for acute pulmonary embolism.  Linear density in the right lower lobe pulmonary artery segmental branches appears to be chronic and represent sequela of prior pulmonary embolism.  2.Down syndrome  3.History of bilateral carotid artery surgery for treatment ofmoyamoyadisease  4.Recurrent ear infections  5.Low back pain, status post lumbar fusion January 2017 6.   Left C3-4, C4-5 laminectomy and foraminotomy 07/13/2018 Disposition: Nicole Wilson appears stable.  She will continue indefinite anticoagulation therapy.  She will follow-up with cardiology regarding the intermittent chest pain.  We recommended follow-up with her PCP regarding the anxiety issues as well as sinus drainage.  She will return for a follow-up visit here in 1 year.  Patient seen with Dr. Benay Spice.    Ned Card ANP/GNP-BC   09/05/2018  10:06 AM This was a shared visit with Ned Card.  Nicole Wilson will continue anticoagulation therapy.  Julieanne Manson, MD

## 2018-09-07 ENCOUNTER — Ambulatory Visit (INDEPENDENT_AMBULATORY_CARE_PROVIDER_SITE_OTHER): Payer: 59

## 2018-09-07 ENCOUNTER — Ambulatory Visit (HOSPITAL_COMMUNITY): Payer: 59 | Attending: Cardiology

## 2018-09-07 ENCOUNTER — Other Ambulatory Visit: Payer: Self-pay

## 2018-09-07 DIAGNOSIS — R079 Chest pain, unspecified: Secondary | ICD-10-CM | POA: Diagnosis not present

## 2018-09-07 LAB — EXERCISE TOLERANCE TEST
CHL CUP MPHR: 173 {beats}/min
CSEPED: 2 min
CSEPEW: 2.1 METS
CSEPHR: 57 %
CSEPPHR: 100 {beats}/min
Exercise duration (sec): 14 s
RPE: 15
Rest HR: 55 {beats}/min

## 2018-09-07 MED ORDER — PERFLUTREN LIPID MICROSPHERE
1.0000 mL | INTRAVENOUS | Status: AC | PRN
  Administered 2018-09-07: 2 mL via INTRAVENOUS

## 2018-09-09 ENCOUNTER — Telehealth: Payer: Self-pay

## 2018-09-09 DIAGNOSIS — Z01812 Encounter for preprocedural laboratory examination: Secondary | ICD-10-CM

## 2018-09-09 DIAGNOSIS — R079 Chest pain, unspecified: Secondary | ICD-10-CM

## 2018-09-09 MED ORDER — METOPROLOL TARTRATE 100 MG PO TABS: ORAL_TABLET | ORAL | 0 refills | Status: DC

## 2018-09-09 NOTE — Telephone Encounter (Signed)
Spoke with the patient's father, per dpr, he expressed understanding about the cardiac CT. 100 mg of Lopressor was ordered. Patient given instructions and a letter is made and sent to home. A BMET was ordered to be done before the cardiac Ct. Message sent to [facility] to schedule and precert.

## 2018-09-09 NOTE — Telephone Encounter (Signed)
Notes recorded by Sueanne Margarita, MD on 09/07/2018 at 8:30 PM EDT Submaximal stress test due to inability to reach target HR. Please get a coronary CTA with FFR (patient will not be able to lay still long enough for nuclear stress test

## 2018-09-27 DIAGNOSIS — H903 Sensorineural hearing loss, bilateral: Secondary | ICD-10-CM | POA: Diagnosis not present

## 2018-09-27 DIAGNOSIS — H6521 Chronic serous otitis media, right ear: Secondary | ICD-10-CM | POA: Diagnosis not present

## 2018-09-27 DIAGNOSIS — Q909 Down syndrome, unspecified: Secondary | ICD-10-CM | POA: Diagnosis not present

## 2018-11-02 ENCOUNTER — Encounter: Payer: Self-pay | Admitting: Cardiology

## 2018-11-18 ENCOUNTER — Other Ambulatory Visit: Payer: Self-pay | Admitting: *Deleted

## 2018-11-18 MED ORDER — GABAPENTIN 600 MG PO TABS: 600.0000 mg | ORAL_TABLET | Freq: Two times a day (BID) | ORAL | 5 refills | Status: AC

## 2018-12-02 ENCOUNTER — Telehealth (HOSPITAL_COMMUNITY): Payer: Self-pay | Admitting: Emergency Medicine

## 2018-12-02 NOTE — Telephone Encounter (Signed)
Reaching out to patient to offer assistance regarding upcoming cardiac imaging study; pt verbalizes understanding of appt date/time, parking situation and where to check in, pre-test NPO status and medications ordered, and verified current allergies; name and call back number provided for further questions should they arise Dietrick Barris RN Navigator Cardiac Imaging [facility] [phone] office [phone] cell 

## 2018-12-06 ENCOUNTER — Ambulatory Visit (HOSPITAL_COMMUNITY)
Admission: RE | Admit: 2018-12-06 | Discharge: 2018-12-06 | Disposition: A | Discharge status: Home / Self Care | Payer: 59 | Source: Ambulatory Visit | Attending: Cardiology | Admitting: Cardiology

## 2018-12-06 ENCOUNTER — Encounter (HOSPITAL_COMMUNITY): Payer: Self-pay

## 2018-12-06 DIAGNOSIS — R079 Chest pain, unspecified: Secondary | ICD-10-CM | POA: Diagnosis present

## 2018-12-06 MED ORDER — NITROGLYCERIN 0.4 MG SL SUBL
0.4000 mg | SUBLINGUAL_TABLET | Freq: Once | SUBLINGUAL | Status: AC
  Administered 2018-12-06: 0.4 mg via SUBLINGUAL
  Filled 2018-12-06: qty 25

## 2018-12-06 MED ORDER — IOPAMIDOL (ISOVUE-370) INJECTION 76%
100.0000 mL | Freq: Once | INTRAVENOUS | Status: AC | PRN
  Administered 2018-12-06: 100 mL via INTRAVENOUS

## 2018-12-06 MED ORDER — NITROGLYCERIN 0.4 MG SL SUBL
SUBLINGUAL_TABLET | SUBLINGUAL | Status: AC
  Administered 2018-12-06: 0.4 mg via SUBLINGUAL
  Filled 2018-12-06: qty 1

## 2018-12-07 ENCOUNTER — Encounter: Payer: Self-pay | Admitting: Cardiology

## 2018-12-07 NOTE — Telephone Encounter (Signed)
New Message   Patient returning Nicole Sailors RN phone call about CT test results.

## 2018-12-07 NOTE — Telephone Encounter (Signed)
See Result note 

## 2019-02-28 ENCOUNTER — Other Ambulatory Visit: Payer: Self-pay | Admitting: Oncology

## 2019-02-28 DIAGNOSIS — I82402 Acute embolism and thrombosis of unspecified deep veins of left lower extremity: Secondary | ICD-10-CM

## 2019-04-11 ENCOUNTER — Other Ambulatory Visit: Payer: Self-pay | Admitting: *Deleted

## 2019-04-11 NOTE — Progress Notes (Signed)
Father reporting over past month she has been having intermittent swelling in her left foot and ankle. There is no pattern to when it happens. Her foot will also turn "purple for a couple days" as well. Asking to have Dr. Benay Spice see her and give them some guidance as well about other issue she is having. Sees cardiologist, Dr. Philbert Riser at [facility] on 04/17/19. Also having tremors of left arm with headaches, jerking of her head and she has passed out as well. Neurologist, Dr. Linus Salmons at [facility] not able to find anything and told them "he can't find anything wrong". He has now retired. Requests Dr. Benay Spice see her at first opportunity.

## 2019-08-08 ENCOUNTER — Telehealth: Payer: Self-pay | Admitting: Oncology

## 2019-08-08 NOTE — Telephone Encounter (Signed)
R/s appt per 9/21 sch message - pt father aware of new appt date and time

## 2019-09-05 ENCOUNTER — Ambulatory Visit: Payer: Self-pay | Admitting: Oncology

## 2019-09-12 ENCOUNTER — Other Ambulatory Visit: Payer: Self-pay

## 2019-09-12 ENCOUNTER — Inpatient Hospital Stay: Payer: 59 | Attending: Oncology | Admitting: Oncology

## 2019-09-12 ENCOUNTER — Telehealth: Payer: Self-pay | Admitting: Oncology

## 2019-09-12 VITALS — BP 122/73 | HR 61 | Temp 98.3°F | Resp 17 | Ht 59.0 in | Wt 153.5 lb

## 2019-09-12 DIAGNOSIS — Q909 Down syndrome, unspecified: Secondary | ICD-10-CM | POA: Diagnosis not present

## 2019-09-12 DIAGNOSIS — Z86718 Personal history of other venous thrombosis and embolism: Secondary | ICD-10-CM | POA: Diagnosis not present

## 2019-09-12 DIAGNOSIS — Z7901 Long term (current) use of anticoagulants: Secondary | ICD-10-CM | POA: Diagnosis not present

## 2019-09-12 DIAGNOSIS — I82402 Acute embolism and thrombosis of unspecified deep veins of left lower extremity: Secondary | ICD-10-CM

## 2019-09-12 DIAGNOSIS — Z86711 Personal history of pulmonary embolism: Secondary | ICD-10-CM | POA: Insufficient documentation

## 2019-09-12 NOTE — Progress Notes (Signed)
  [facility] OFFICE PROGRESS NOTE   Diagnosis: DVT/pulmonary embolism  INTERVAL HISTORY:   Ms. Grable returns as scheduled.  She is here today with her father.  She continues to have intermittent tingling in the upper and lower extremity.  She has intermittent episodes of the left leg giving way.  She is followed by neurosurgery.  No bleeding or symptom of recurrent thrombosis.  She has intermittent episodes of anxiety.  Objective:  Vital signs in last 24 hours:  Blood pressure 122/73, pulse 61, temperature 98.3 F (36.8 C), temperature source Temporal, resp. rate 17, height 4\' 11"  (1.499 m), weight 153 lb 8 oz (69.6 kg), SpO2 100 %.     GI: No hepatosplenomegaly Vascular: The left lower leg is slightly larger than the right side with stasis change and venous engorgement Neuro: The motor exam is intact in the upper and lower extremities bilaterally with slight weakness with flexion of the left hip and extension at the left knee     Lab Results:  Lab Results  Component Value Date   WBC 5.9 08/29/2018   HGB 13.0 08/29/2018   HCT 41.0 08/29/2018   MCV 98.3 08/29/2018   PLT 295 08/29/2018   NEUTROABS 4.3 08/29/2018    CMP  Lab Results  Component Value Date   NA 139 08/29/2018   K 3.7 08/29/2018   CL 108 08/29/2018   CO2 24 08/29/2018   GLUCOSE 90 08/29/2018   BUN 11 08/29/2018   CREATININE 0.87 08/29/2018   CALCIUM 8.7 (L) 08/29/2018   PROT 6.9 08/29/2018   ALBUMIN 3.2 (L) 08/29/2018   AST 24 08/29/2018   ALT 15 08/29/2018   ALKPHOS 61 08/29/2018   BILITOT 0.2 (L) 08/29/2018   GFRNONAA >60 08/29/2018   GFRAA >60 08/29/2018     Medications: I have reviewed the patient's current medications.   Assessment/Plan: 1. Extensive left lower extremity DVT and bilateral pulmonary embolism12/02/2017 ? Presentation with a syncope event 10/18/2017 ? Left lower extremity Doppler 10/19/2017 confirmed extensive left lower deep and superficial venous  thrombosis ? CT chest 10/20/2017 with bilateral central pulmonary emboli with right heart strain ? Left lower extremity thrombectomy procedure 10/20/2017 ? Heparin anticoagulation followed by apixaban ? CT 01/06/2018-resolution of pulmonary emboli ? CT 09/05/2018-negative for acute pulmonary embolism.  Linear density in the right lower lobe pulmonary artery segmental branches appears to be chronic and represent sequela of prior pulmonary embolism.  2.Down syndrome  3.History of bilateral carotid artery surgery for treatment ofmoyamoyadisease  4.Recurrent ear infections  5.Low back pain, status post lumbar fusion January 2017 6.   Left C3-4, C4-5 laminectomy and foraminotomy 07/13/2018    Disposition: Ms. Garbisch remains on chronic anticoagulation therapy after a DVT and pulmonary embolism in December 2018.  She appears to be tolerating the anticoagulation well.  She will return for an office visit in 1 year.  I recommended she discuss the indication for continuing aspirin with her primary provider and cardiologist given the increased bleeding risk with the combination of aspirin and a DOAC.  She will follow up with her neurosurgeon and other physicians for evaluation of the extremity tingling and left-sided weakness.  Betsy Coder, MD  09/12/2019  4:06 PM

## 2019-09-12 NOTE — Telephone Encounter (Signed)
Scheduled per los. Patient declined printout  

## 2019-11-13 ENCOUNTER — Other Ambulatory Visit: Payer: Self-pay

## 2019-11-13 NOTE — Telephone Encounter (Signed)
Patient no longer followed here and needs to request from her PCP or [facility] neurologist.

## 2019-11-14 ENCOUNTER — Encounter: Payer: Self-pay | Admitting: Oncology

## 2020-02-08 ENCOUNTER — Other Ambulatory Visit: Payer: Self-pay | Admitting: Oncology

## 2020-02-08 DIAGNOSIS — I82402 Acute embolism and thrombosis of unspecified deep veins of left lower extremity: Secondary | ICD-10-CM

## 2020-09-12 ENCOUNTER — Encounter: Payer: Self-pay | Admitting: *Deleted

## 2020-09-12 ENCOUNTER — Inpatient Hospital Stay: Payer: 59 | Attending: Oncology | Admitting: Oncology

## 2020-09-12 NOTE — Progress Notes (Signed)
Patient was "no show" today. Per Dr. Benay Spice, not necessary to f/u at [facility].

## 2020-11-13 ENCOUNTER — Other Ambulatory Visit: Payer: Self-pay | Admitting: Oncology

## 2020-11-13 DIAGNOSIS — I82402 Acute embolism and thrombosis of unspecified deep veins of left lower extremity: Secondary | ICD-10-CM

## 2021-07-18 ENCOUNTER — Ambulatory Visit (INDEPENDENT_AMBULATORY_CARE_PROVIDER_SITE_OTHER): Payer: 59 | Admitting: Obstetrics and Gynecology

## 2021-07-18 ENCOUNTER — Other Ambulatory Visit: Payer: Self-pay

## 2021-07-18 ENCOUNTER — Encounter: Payer: Self-pay | Admitting: Obstetrics and Gynecology

## 2021-07-18 VITALS — BP 116/73 | HR 65 | Wt 171.0 lb

## 2021-07-18 DIAGNOSIS — N858 Other specified noninflammatory disorders of uterus: Secondary | ICD-10-CM | POA: Diagnosis not present

## 2021-07-18 DIAGNOSIS — Z124 Encounter for screening for malignant neoplasm of cervix: Secondary | ICD-10-CM | POA: Diagnosis not present

## 2021-07-18 NOTE — Patient Instructions (Signed)
Uterine Fibroids Uterine fibroids are lumps of tissue (tumors) in the womb (uterus). Fibroids are not cancerous. Most women with this condition do not need treatment. Sometimes, fibroids can make it harder to have children. If this happens, you may need surgery to take out the fibroids. What are the causes? The cause of this condition is not known. What increases the risk? You are in your 30s or 40s and have not gone through menopause. Menopause is when you have not had a menstrual period for 12 months. Having a history of fibroids in your family. You are of African American descent. You started your period at age 74 or younger. You have not given birth. You are overweight or very overweight. What are the signs or symptoms? Bleeding between menstrual periods. Heavy bleeding during your menstrual period. Pain in the area between your hips. Needing to pee (urinate) right away or more often than usual. Not being able to have children (infertility). Not being able to stay pregnant (miscarriage). Many women do not have symptoms.  How is this treated? Treatment may include: Follow-up visits with your doctor to check your fibroids for any changes. Medicines to help with pain, such as aspirin or ibuprofen. Hormone therapy. This may be given as a pill, in a shot, or with a type of birth control device called an IUD. Surgery that would do one of these things: Take out the fibroids. This may be done if you want to become pregnant. Take out the womb (hysterectomy). Stop the blood flow to the fibroids. Follow these instructions at home: Medicines Take over-the-counter and prescription medicines only as told by your doctor. Ask your doctor if you should: Take iron pills. Eat more foods that have a lot of iron in them, such as dark green, leafy vegetables. Managing pain If told, put heat on your back or belly. Do this as often as told by your doctor. Use the heat source that your doctor  recommends, such as a moist heat pack or a heating pad. To do this: Put a towel between your skin and the heat pack or pad. Leave the heat on for 20-30 minutes. Take off the heat if your skin turns bright red. This is very important. If you cannot feel pain, heat, or cold, you may have a greater risk of getting burned.  General instructions Tell your doctor about any changes to your menstrual period, such as: Heavy bleeding that needs a change of tampons or pads more than normal. A change in how many days your period lasts. A change in symptoms that come with your period. This might be belly cramps or back pain. Keep all follow-up visits. Contact a doctor if: You have pain that does not get better with medicine or heat. This may include pain or cramps in: The area between your hip bones. Your back. Your belly. You have new bleeding between your periods. You have more bleeding during or between your periods. You feel very tired or weak. You feel dizzy. Get help right away if: You faint. You have pain in the area between your hip bones that gets worse. You have bleeding that soaks a tampon or pad in 30 minutes or less. Summary Uterine fibroids are lumps of tissue (tumors) in your womb. They are not cancerous. Medicines such as aspirin or ibuprofen may be used to help with pain. Contact a doctor if you have pain or cramps that do not get better with medicine. Know the symptoms for when you should  get help right away. This information is not intended to replace advice given to you by your health care provider. Make sure you discuss any questions you have with your health care provider. Document Revised: 06/04/2020 Document Reviewed: 06/04/2020 Elsevier Patient Education  [place].

## 2021-07-18 NOTE — Progress Notes (Signed)
Referral to discuss abnormal CT scan/uterine mass. Results scanned in media.

## 2021-07-20 NOTE — Progress Notes (Signed)
Nicole Wilson presents with her family for evaluation after enlarged uterus with probably uterine fibroid noted on MRI of the back. Pt has DS and is cared for by her parents. She had a cycle last month and previous cycle was 9 months prior. Pt is not sexual active nor ever has been. Lives with her parents.  Uncertain of last pap smear. Denies any bowel or bladder dysfunction.  H/O DVT/PE on chronic anticoagulation.   PE AF VSS Lungs clear Heart RRR Abd soft + BS, abd/pelvic mass affect @ 18-20 weeks GU Nl EGBUS, pt unable to tolerated speculum exam for pap smear and bimanual exam was not attempted.  A/P Enlarged uterus, suspect uterine fibroid  Reviewed with pt and family members. Will check GYN U/S. Discussed pending U/S results conservative management with f/u U/S in 4-6 months. F/U per GYN U/S results

## 2021-07-28 ENCOUNTER — Ambulatory Visit (HOSPITAL_BASED_OUTPATIENT_CLINIC_OR_DEPARTMENT_OTHER)
Admission: RE | Admit: 2021-07-28 | Discharge: 2021-07-28 | Disposition: A | Discharge status: Home / Self Care | Payer: 59 | Source: Ambulatory Visit | Attending: Obstetrics and Gynecology | Admitting: Obstetrics and Gynecology

## 2021-07-28 ENCOUNTER — Other Ambulatory Visit: Payer: Self-pay | Admitting: Obstetrics and Gynecology

## 2021-07-28 ENCOUNTER — Other Ambulatory Visit: Payer: Self-pay

## 2021-07-28 DIAGNOSIS — N858 Other specified noninflammatory disorders of uterus: Secondary | ICD-10-CM

## 2021-07-29 NOTE — Progress Notes (Signed)
TC to patient's father, caregiver. Notified of [place] results and recommendation for follow up US in 4-6 months. Caregiver voices understanding and will call to schedule follow up appointment.

## 2021-09-02 ENCOUNTER — Other Ambulatory Visit: Payer: Self-pay | Admitting: Oncology

## 2021-09-02 DIAGNOSIS — I82402 Acute embolism and thrombosis of unspecified deep veins of left lower extremity: Secondary | ICD-10-CM

## 2021-09-06 ENCOUNTER — Other Ambulatory Visit: Payer: Self-pay | Admitting: Oncology

## 2021-09-06 DIAGNOSIS — I82402 Acute embolism and thrombosis of unspecified deep veins of left lower extremity: Secondary | ICD-10-CM

## 2021-09-09 ENCOUNTER — Telehealth: Payer: Self-pay | Admitting: *Deleted

## 2021-09-09 NOTE — Telephone Encounter (Signed)
Received request for refill on Eliquis. Last OV was 10/27/202 with "no show" on 09/12/2020. Spoke w/father (caregiver): confirms she is taking it bid and agreed to schedule f/u with Dr. Benay Spice for 111/4 at 10:20. Is aware of office location.

## 2021-09-18 NOTE — Progress Notes (Signed)
  [facility] OFFICE PROGRESS NOTE   Diagnosis: DVT/pulmonary embolism  INTERVAL HISTORY:   Ms. Nicole Wilson returns as scheduled.  She is here today with her mother and father.  She continues apixaban anticoagulation.  No bleeding or symptom of recurrent thrombosis.  She is followed by cardiology for orthostasis.  She is followed by neurosurgery for back pain.  Objective:  Vital signs in last 24 hours:  Blood pressure 123/61, pulse 60, temperature 98.4 F (36.9 C), temperature source Oral, resp. rate 18, weight 168 lb 12.8 oz (76.6 kg), SpO2 99 %.    Resp: Lungs clear bilaterally Cardio: Regular rate and rhythm GI: No hepatosplenomegaly Vascular: No leg edema.  Skin: Light brown discoloration of the left lower leg  Lab Results:  Lab Results  Component Value Date   WBC 5.9 08/29/2018   HGB 13.0 08/29/2018   HCT 41.0 08/29/2018   MCV 98.3 08/29/2018   PLT 295 08/29/2018   NEUTROABS 4.3 08/29/2018     Medications: I have reviewed the patient's current medications.   Assessment/Plan: Extensive left lower extremity DVT and bilateral pulmonary embolism 10/19/2017 Presentation with a syncope event 10/18/2017 Left lower extremity Doppler 10/19/2017 confirmed extensive left lower deep and superficial venous thrombosis CT chest 10/20/2017 with bilateral central pulmonary emboli with right heart strain Left lower extremity thrombectomy procedure 10/20/2017 Heparin anticoagulation followed by apixaban CT 01/06/2018-resolution of pulmonary emboli CT 09/05/2018-negative for acute pulmonary embolism.  Linear density in the right lower lobe pulmonary artery segmental branches appears to be chronic and represent sequela of prior pulmonary embolism.   2.   Down syndrome   3.   History of bilateral carotid artery surgery for treatment of moyamoya disease   4.   Recurrent ear infections   5.   Low back pain, status post lumbar fusion January 2017 6.   Left C3-4, C4-5  laminectomy and foraminotomy 07/13/2018     Disposition: Nicole Wilson appears stable.  She continues apixaban anticoagulation after being diagnosed with a left lower extremity DVT and bilateral pulmonary embolism in December 2018.  I recommend she continue anticoagulation therapy.  She is also taking aspirin.  She will consult with cardiology and neurology regarding the indication to continue aspirin therapy.  She will return for an office visit in 1 year.  Betsy Coder, MD  09/19/2021  1:47 PM

## 2021-09-19 ENCOUNTER — Inpatient Hospital Stay: Payer: 59 | Attending: Oncology | Admitting: Oncology

## 2021-09-19 ENCOUNTER — Telehealth: Payer: Self-pay

## 2021-09-19 ENCOUNTER — Other Ambulatory Visit: Payer: Self-pay

## 2021-09-19 VITALS — BP 123/61 | HR 60 | Temp 98.4°F | Resp 18 | Wt 168.8 lb

## 2021-09-19 DIAGNOSIS — Z7901 Long term (current) use of anticoagulants: Secondary | ICD-10-CM | POA: Diagnosis not present

## 2021-09-19 DIAGNOSIS — I2609 Other pulmonary embolism with acute cor pulmonale: Secondary | ICD-10-CM

## 2021-09-19 DIAGNOSIS — Q909 Down syndrome, unspecified: Secondary | ICD-10-CM | POA: Insufficient documentation

## 2021-09-19 DIAGNOSIS — Z86711 Personal history of pulmonary embolism: Secondary | ICD-10-CM | POA: Diagnosis present

## 2021-09-19 DIAGNOSIS — Z86718 Personal history of other venous thrombosis and embolism: Secondary | ICD-10-CM | POA: Insufficient documentation

## 2021-09-19 NOTE — Telephone Encounter (Signed)
Per Dr Benay Spice TC to Pt's father to inform him to discuss indication for aspirin with the Neurologist and Pt's Cardiologist reason is she might need the aspirin because of previous history of strokes. Pt's father verbalized understanding.

## 2021-12-06 ENCOUNTER — Other Ambulatory Visit: Payer: Self-pay | Admitting: Oncology

## 2021-12-06 DIAGNOSIS — I82402 Acute embolism and thrombosis of unspecified deep veins of left lower extremity: Secondary | ICD-10-CM

## 2022-03-13 ENCOUNTER — Other Ambulatory Visit: Payer: Self-pay | Admitting: Oncology

## 2022-03-13 DIAGNOSIS — I82402 Acute embolism and thrombosis of unspecified deep veins of left lower extremity: Secondary | ICD-10-CM

## 2022-06-06 ENCOUNTER — Other Ambulatory Visit: Payer: Self-pay | Admitting: Oncology

## 2022-06-06 DIAGNOSIS — I82402 Acute embolism and thrombosis of unspecified deep veins of left lower extremity: Secondary | ICD-10-CM

## 2022-09-01 ENCOUNTER — Other Ambulatory Visit: Payer: Self-pay | Admitting: Oncology

## 2022-09-01 DIAGNOSIS — I82402 Acute embolism and thrombosis of unspecified deep veins of left lower extremity: Secondary | ICD-10-CM

## 2022-09-18 ENCOUNTER — Inpatient Hospital Stay: Payer: 59 | Admitting: Oncology

## 2022-10-06 ENCOUNTER — Inpatient Hospital Stay: Payer: 59 | Attending: Oncology | Admitting: Oncology

## 2022-10-06 ENCOUNTER — Encounter: Payer: Self-pay | Admitting: *Deleted

## 2022-10-06 VITALS — BP 112/74 | HR 57 | Temp 98.1°F | Resp 18 | Ht 59.0 in | Wt 144.0 lb

## 2022-10-06 DIAGNOSIS — Z86711 Personal history of pulmonary embolism: Secondary | ICD-10-CM | POA: Insufficient documentation

## 2022-10-06 DIAGNOSIS — I2609 Other pulmonary embolism with acute cor pulmonale: Secondary | ICD-10-CM

## 2022-10-06 DIAGNOSIS — Q909 Down syndrome, unspecified: Secondary | ICD-10-CM | POA: Diagnosis not present

## 2022-10-06 DIAGNOSIS — Z86718 Personal history of other venous thrombosis and embolism: Secondary | ICD-10-CM | POA: Diagnosis present

## 2022-10-06 DIAGNOSIS — Z7901 Long term (current) use of anticoagulants: Secondary | ICD-10-CM | POA: Insufficient documentation

## 2022-10-06 DIAGNOSIS — Z7982 Long term (current) use of aspirin: Secondary | ICD-10-CM | POA: Diagnosis not present

## 2022-10-06 NOTE — Progress Notes (Signed)
  [facility] OFFICE PROGRESS NOTE   Diagnosis: History of pulmonary embolism, anticoagulation therapy  INTERVAL HISTORY:   Nicole Wilson continues apixaban anticoagulation.  She is taking aspirin.  No bleeding or symptom of recurrent thrombosis.  She has intermittent episodes of chest pain lasting for approximately 1 minute.  Her father relates the pain to anxiety.  She complains of intermittent dizziness and left-sided tingling.  Good appetite.  Objective:  Vital signs in last 24 hours:  Blood pressure 112/74, pulse (!) 57, temperature 98.1 F (36.7 C), temperature source Oral, resp. rate 18, height '4\' 11"'$  (1.499 m), weight 144 lb (65.3 kg), SpO2 100 %.    Resp: Lungs clear, no respiratory distress Cardio: Regular rate and rhythm GI: No hepatosplenomegaly, nontender Vascular: No edema, the left lower leg is larger than the right side PICC-without erythema  Lab Results:  Lab Results  Component Value Date   WBC 5.9 08/29/2018   HGB 13.0 08/29/2018   HCT 41.0 08/29/2018   MCV 98.3 08/29/2018   PLT 295 08/29/2018   NEUTROABS 4.3 08/29/2018    CMP  Lab Results  Component Value Date   NA 139 08/29/2018   K 3.7 08/29/2018   CL 108 08/29/2018   CO2 24 08/29/2018   GLUCOSE 90 08/29/2018   BUN 11 08/29/2018   CREATININE 0.87 08/29/2018   CALCIUM 8.7 (L) 08/29/2018   PROT 6.9 08/29/2018   ALBUMIN 3.2 (L) 08/29/2018   AST 24 08/29/2018   ALT 15 08/29/2018   ALKPHOS 61 08/29/2018   BILITOT 0.2 (L) 08/29/2018   GFRNONAA >60 08/29/2018   GFRAA >60 08/29/2018    Medications: I have reviewed the patient's current medications.   Assessment/Plan: Extensive left lower extremity DVT and bilateral pulmonary embolism 10/19/2017 Presentation with a syncope event 10/18/2017 Left lower extremity Doppler 10/19/2017 confirmed extensive left lower deep and superficial venous thrombosis CT chest 10/20/2017 with bilateral central pulmonary emboli with right heart  strain Left lower extremity thrombectomy procedure 10/20/2017 Heparin anticoagulation followed by apixaban CT 01/06/2018-resolution of pulmonary emboli CT 09/05/2018-negative for acute pulmonary embolism.  Linear density in the right lower lobe pulmonary artery segmental branches appears to be chronic and represent sequela of prior pulmonary embolism.   2.   Down syndrome   3.   History of bilateral carotid artery surgery for treatment of moyamoya disease   4.   Recurrent ear infections   5.   Low back pain, status post lumbar fusion January 2017 6.   Left C3-4, C4-5 laminectomy and foraminotomy 07/13/2018       Disposition: Nicole Wilson appears unchanged.  No symptoms to suggest recurrent venous thromboembolic disease.  She continues apixaban anticoagulation.  She is also taking aspirin.  Her father reports she had a stroke in the remote past.  We will contact neurology to get their recommendation regarding the indication for continuing aspirin therapy.  She would like to continue follow-up in the hematology clinic.  She will return for an office visit in 1 year.  Her father reports Nicole Wilson will be moving into an assisted living facility in the near future.  Nicole Coder, MD  10/06/2022  10:53 AM

## 2022-10-06 NOTE — Progress Notes (Signed)
Per Dr. Gearldine Shown request, faxed today's office note with question to neurology at [facility], Julianne Handler, NP stating she is on aspirin. MD asking if she needs to continue this.  Fax [phone].

## 2022-10-23 ENCOUNTER — Encounter: Payer: Self-pay | Admitting: *Deleted

## 2022-10-23 NOTE — Progress Notes (Signed)
Received call from Dr. Miguel Rota Couture's NP that as long as Baley is on Eliquis, she does not need the ASA 81 mg.  Called Mr. Casalino and made him aware via VM.

## 2022-11-30 ENCOUNTER — Telehealth: Payer: Self-pay | Admitting: Family Medicine

## 2022-11-30 ENCOUNTER — Encounter: Payer: Self-pay | Admitting: Family Medicine

## 2022-11-30 ENCOUNTER — Non-Acute Institutional Stay: Payer: 59 | Admitting: Family Medicine

## 2022-11-30 VITALS — BP 104/64 | HR 58 | Temp 97.2°F | Resp 18

## 2022-11-30 DIAGNOSIS — R079 Chest pain, unspecified: Secondary | ICD-10-CM

## 2022-11-30 DIAGNOSIS — F419 Anxiety disorder, unspecified: Secondary | ICD-10-CM

## 2022-11-30 DIAGNOSIS — Z515 Encounter for palliative care: Secondary | ICD-10-CM

## 2022-11-30 NOTE — Telephone Encounter (Signed)
TCT pt's father/guardian to discuss recent visit.  Left message introducing self and providing contact phone number. Damaris Hippo FNP-C

## 2022-11-30 NOTE — Progress Notes (Signed)
[facility] Consult Note Telephone: [phone]  Fax: [phone]   Date of encounter: 11/30/22 12:37 PM PATIENT NAME: Nicole Wilson [place] [address]   [phone] (home) [phone] (work) DOB: 12-05-1970 MRN: [id number] PRIMARY CARE PROVIDER:    Aretta Nip, MD,  [place] [address] [phone]  REFERRING PROVIDER:   Aretta Nip, MD [place],  [place] [address] [phone]  RESPONSIBLE PARTY:    Contact Information     Name Relation Home Work Nicole Wilson Father   [phone]   [place] Mother [phone]  [phone]        I met face to face with patient in [facility] facility. Palliative Care was asked to follow this patient by consultation request of  Nicole Wilson, Nicole Salinas, MD to address advance care planning and complex medical decision making. This is the initial visit.          ASSESSMENT, SYMPTOM MANAGEMENT AND PLAN / RECOMMENDATIONS:   Chest pain Recommend efforts to redirect as pt becomes anxious If pain continues or is accompanied by SOB, nausea/vomiting, back or jaw pain then would obtain more in-depth assessment. Continue ASA 81 mg EC tablet, Eliquis 5 mg BID   Anxiety Attempt frequent redirection if pt's anxiety starts to escalate. Continue Sertraline 150 mg daily Minimize stimulation at meals so pt can focus on eating and not have trouble swallowing.  3.  Palliative Care Encounter Living in same room in memory care as her mother Attempt to include the mother and develop a rapport with patient  Follow up Palliative Care Visit: Palliative care will continue to follow for complex medical decision making, advance care planning, and clarification of goals. Return 4 weeks or prn.    This visit was coded based on medical decision making (MDM).  PPS: 60%  HOSPICE  ELIGIBILITY/DIAGNOSIS: TBD  Chief Complaint:  [facility] received a referral to follow up with patient for chronic disease management with dementia in setting of Down's Syndrome.  Palliative Care is also following for advance directive planning and defining/refining goals of care.  HISTORY OF PRESENT ILLNESS:  Nicole Wilson is a 52 y.o. year old female  with Down Syndrome in a setting of dementia with behavioral disturbance and anxiety. She also has Moyamoya Disease, chronic DVT, mitral valve prolapse, hypothyroidism and degenerative cervical spinal stenosis. Facility records indicate pt is continent of bowel and bladder.  Able to ambulate with rollator per PT notes 20 feet. She lives in same memory care assisted apartment as her mother who has dementia. Has some coughing after eating or drinking.  Pt c/o having intermittent chest pains and describes having some difficulty getting  a deep breath.  Mother states she notes this when pt is anxious.  She states she occasionally has pain in her right arm and leg  when she had a stroke it was worse but still has some times. Denies falls.  She states not liking to be hot then she feels dizzy and may get lightheaded.  On last visit with Hem-Onc Dr Nicole Wilson who was seeing her for her hx of extensive LLE DVT and bilateral PE (resolved 01/06/2018) she was having same symptoms of brief chest pain lasting about 1 minute, which pt's father related to anxiety, intermittent dizziness and left sided tingling. She does require assistance with bathing and dressing.  History obtained from review of EMR,  interview with family, facility staff and/or Nicole Wilson.   11/10/22 [facility]: CBC unremarkable CMP remarkable for elevated anion gap 16.0 and low osmolality 283.3, normal Cr 0.83 and eGFR 81. TSH elevated at 6.28 B12 low normal 278 Lipid panel unremarkable Vitamin D level sufficient at 35.0 I reviewed EMR for available labs, medications,  imaging, studies and related documents.  Records reviewed as per above.  ROS General: NAD ENMT: endorses intermittent dysphagia Cardiovascular: endorses intermittent chest pain of longstanding, denies DOE Pulmonary: denies cough, endorses intermittent difficulty taking a deep breath Abdomen: endorses good appetite, denies constipation, endorses continence of bowel GU: denies dysuria, endorses continence of urine MSK:  has had left sided weakness since stroke, no falls reported Skin: denies rashes or wounds Neurological: endorses intermittent pain, denies insomnia Psych: mother endorses pt with anxiety Heme/lymph/immuno: denies bruises, abnormal bleeding  Physical Exam: Current and past weights: 144 lbs as of 10/06/22 at [facility] Constitutional: NAD General: WD/overweight ENMT: intact hearing with hearing aid, oral mucous membranes moist, dentition intact CV: S1S2, RRR with LUSB murmur, no LE edema, wearing stockings Pulmonary: CTAB, no increased work of breathing, no cough, room air Abdomen: normo-active BS + 4 quadrants, soft and non tender, no ascites GU: deferred MSK: no sarcopenia, moves all extremities, ambulatory with rollator and SBA Skin: warm and dry, no rashes or wounds on visible skin Neuro:  left side weaker than right, noted cognitive impairment Psych: mod anxious affect, A and O x 2 Hem/lymph/immuno: no widespread bruising  CURRENT PROBLEM LIST:  Patient Active Problem List   Diagnosis Date Noted   Uterine mass 07/18/2021   Chest pain 08/29/2018   Left leg DVT ([place]) 10/20/2017   Pulmonary embolism with acute cor pulmonale ([place]) 10/20/2017   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 ([place]) 10/20/2017   Pulmonary emboli ([place]) 10/16/2017   Obesity (BMI 30-39.9) 06/01/2017   HLD (hyperlipidemia) 11/25/2015   Spondylolisthesis of lumbosacral region 11/25/2015   Cervical stenosis of spinal canal 03/16/2014   Chronic chest wall pain 10/06/2013    Bradycardia    Carotid artery occlusion    MVP (mitral valve prolapse)    Hypothyroidism    Orthostatic hypotension    Down syndrome    Hidradenitis    Dyslipidemia    Allergic rhinitis    Moyamoya disease 03/03/2012   PAST MEDICAL HISTORY:  Active Ambulatory Problems    Diagnosis Date Noted   Hypothyroidism    Orthostatic hypotension    Down syndrome    Hidradenitis    Dyslipidemia    Allergic rhinitis    Bradycardia    Carotid artery occlusion    MVP (mitral valve prolapse)    Chronic chest wall pain 10/06/2013   Cervical stenosis of spinal canal 03/16/2014   HLD (hyperlipidemia) 11/25/2015   Moyamoya disease 03/03/2012   Spondylolisthesis of lumbosacral region 11/25/2015   Obesity (BMI 30-39.9) 06/01/2017   Left leg DVT ([place]) 10/20/2017   Pulmonary embolism with acute cor pulmonale ([facility]) 10/20/2017   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 ([place]) 10/20/2017   Pulmonary emboli ([place]) 10/16/2017   Chest pain 08/29/2018   Uterine mass 07/18/2021   Resolved Ambulatory Problems    Diagnosis Date Noted   CAD (coronary artery disease)    Past Medical History:  Diagnosis Date   Cervical spondylosis    Down's syndrome    Hyperlipidemia    SOCIAL HX:  Social History   Tobacco Use   Smoking status: Never   Smokeless tobacco:  Never  Substance Use Topics   Alcohol use: No   FAMILY HX:  Family History  Problem Relation Age of Onset   Multiple myeloma Father    Prostate cancer Father    Heart attack Neg Hx    Stroke Neg Hx        Preferred Pharmacy: [facility]:  Allergies  Allergen Reactions   Cephalexin Rash   Phenytoin Other (See Comments) and Rash    Pt doesnt remember  Pt doesnt remember  Pt doesnt remember     PERTINENT MEDICATIONS:  Outpatient Encounter Medications as of 11/30/2022  Medication Sig   acetaminophen (TYLENOL) 650 MG CR tablet Take 1 tablet by mouth 2 (two) times daily as needed.  (Patient not taking: Reported on 10/06/2022)    aspirin EC 81 MG tablet Take 81 mg by mouth daily.   buPROPion (WELLBUTRIN XL) 150 MG 24 hr tablet Take 150 mg by mouth daily.   donepezil (ARICEPT) 5 MG tablet Take 5 mg by mouth daily.   ELIQUIS 5 MG TABS tablet TAKE 1 TABLET BY MOUTH TWICE DAILY   gabapentin (NEURONTIN) 600 MG tablet Take 1 tablet (600 mg total) by mouth 2 (two) times daily.   hydrOXYzine (ATARAX) 25 MG tablet Take 25 mg by mouth 2 (two) times daily as needed. (Patient not taking: Reported on 10/06/2022)   levothyroxine (SYNTHROID, LEVOTHROID) 100 MCG tablet Take 50 mcg by mouth daily.    metoprolol tartrate (LOPRESSOR) 100 MG tablet Take 1, 100 mg tablet, 2 hours before Cardiac CT. (Patient not taking: Reported on 10/06/2022)   midodrine (PROAMATINE) 2.5 MG tablet Take 2.5 mg by mouth 3 (three) times daily with meals.   Multiple Vitamin (MULTIVITAMIN WITH MINERALS) TABS tablet Take 1 tablet by mouth daily.   sertraline (ZOLOFT) 50 MG tablet Take 50 mg by mouth at bedtime.   No facility-administered encounter medications on file as of 11/30/2022.     -------------------------------------------------------------------------------------------------------------------------------------------------------------------------------------------------------------------------------------------- Advance Care Planning/Goals of Care: Goals include to maximize quality of life and symptom management. Patient/health care surrogate gave his/her permission to discuss.Our advance care planning conversation included a discussion about:    The value and importance of advance care planning  Experiences with loved ones who have been seriously ill or have died  Exploration of personal, cultural or spiritual beliefs that might influence medical decisions  Exploration of goals of care in the event of a sudden injury or illness  Identification  of a healthcare agent and guardian is Sharai Overbay (father) [phone] Review and updating or creation of  an  advance directive document . Decision not to resuscitate or to de-escalate disease focused treatments due to poor prognosis. CODE STATUS:     Thank you for the opportunity to participate in the care of Nicole Wilson.  The palliative care team will continue to follow. Please call our office at [phone] if we can be of additional assistance.   Marijo Conception, FNP-C  COVID-19 PATIENT SCREENING TOOL Asked and negative response unless otherwise noted:  Have you had symptoms of covid, tested positive or been in contact with someone with symptoms/positive test in the past 5-10 days? unknown

## 2022-12-01 ENCOUNTER — Telehealth: Payer: Self-pay | Admitting: Family Medicine

## 2022-12-01 NOTE — Telephone Encounter (Signed)
Returning call to Holli Humbles, pt's daughter and [facility] POA.  She states sharing [facility] POA with her sister [place]. She reported that she had gotten the information that I had left a message for pt's father.  Caryl Pina states that their father passed away unexpectedly right before Christmas.  She and her sister had discussed it and opted not to share with pt not knowing how she would cope.  She and her sister, Donah Driver are going to see an attorney tomorrow.  She states that the day that her mother and sister, Jaquelyne were placed in [place] that they had to do so to support their father in an acute illness.  He had undergone CPR and intubation between surgeries.  She states that their goals are to try and keep the pt involved and lessen any anxiety.  Explained that the patient was doing pretty well, did exhibit some anxiety but it seemed to be because this provider was asking some pretty in-depth questions.  She states that for people who work with their sister they tell her it is a friend coming to visit and that tends to dissipate her anxiety.  She states there are games available to her sister which when she is playing will make her just laugh and that she loves people.  She states if pt becomes anxious or looks as if she is possibly having a seizure that they can ask her questions about food, especially sweets and she will follow the topic change and her anxiety dissipates. She states that their mother who lives in the memory care suite with her will get her up in the morning and push her to get dressed.  They want to make sure she is stimulated enough and have wanted her to work with physical therapy to maintain her mobility.  She states the patient's father used to take her and their mother out regularly which they have not been able to do as regularly. Discussed MOST form and goals of care and she is agreeable to my forwarding a electronic copy to hers and her sister's emails.  Advised that they also may be entitled to  bereavement counseling with respect to their father.  Also discussed the options covered by MOST for goals of care and she states they just had to do this for their father. Advised if and when she was interested in completing one I would be happy to work it out for her.  She states that they have not been able to deal with that yet but she would like the contact number to follow up. Advised if they noticed some concern then they could certainly reach out to me at the phone number provided.  Advised I would see her monthly. She provided contact emails as:  [email or web address], [email or web address]  Damaris Hippo FNP-C

## 2022-12-11 ENCOUNTER — Encounter: Payer: Self-pay | Admitting: Family Medicine

## 2022-12-11 DIAGNOSIS — Z515 Encounter for palliative care: Secondary | ICD-10-CM | POA: Insufficient documentation

## 2022-12-12 NOTE — Progress Notes (Incomplete)
[facility] Consult Note Telephone: [phone]  Fax: [phone]   Date of encounter: 11/30/22 12:37 PM PATIENT NAME: Nicole Wilson [place] [address]   [phone] (home) [phone] (work) DOB: [birth year] MRN: [id number] PRIMARY CARE PROVIDER:    Aretta Nip, MD,  [place] [address] [phone]  REFERRING PROVIDER:   Aretta Nip, MD [place],  [place] [address] [phone]  RESPONSIBLE PARTY:    Contact Information     Name Relation Home Work [place] Father   [phone]   [place] Mother [phone]  [phone]        I met face to face with patient in [facility] facility. Palliative Care was asked to follow this patient by consultation request of  Rankins, Bill Salinas, MD to address advance care planning and complex medical decision making. This is the initial visit.          ASSESSMENT, SYMPTOM MANAGEMENT AND PLAN / RECOMMENDATIONS:   Chest pain Recommend efforts to redirect as pt becomes anxious If pain continues or is accompanied by SOB, nausea/vomiting, back or jaw pain then would obtain more in-depth assessment. Continue ASA 81 mg EC tablet, Eliquis 5 mg BID   Palliative Care Encounter   Follow up Palliative Care Visit: Palliative care will continue to follow for complex medical decision making, advance care planning, and clarification of goals. Return 4 weeks or prn.    This visit was coded based on medical decision making (MDM).  PPS: 60%  HOSPICE ELIGIBILITY/DIAGNOSIS: TBD  Chief Complaint:  [facility] received a referral to follow up with patient for chronic disease management with dementia in setting of Down's Syndrome.  Palliative Care is also following for advance directive planning and defining/refining goals of care.  HISTORY OF  PRESENT ILLNESS:  ALEXXA SABET is a 52 y.o. year old female  with Down Syndrome in a setting of dementia with behavioral disturbance and anxiety. She also has Moyamoya Disease, chronic DVT, mitral valve prolapse, hypothyroidism and degenerative cervical spinal stenosis. Facility records indicate pt is continent of bowel and bladder.  Able to ambulate with rollator per PT notes 20 feet. She lives in same memory care assisted apartment as her mother who has dementia. Has some coughing after eating or drinking.  Pt c/o having intermittent chest pains and describes having some difficulty getting  a deep breath.  Mother states she notes this when pt is anxious.  She states she occasionally has pain in her right arm and leg  when she had a stroke it was worse but still has some times. Denies falls.  She states not liking to be hot then she feels dizzy and may get lightheaded.  On last visit with Hem-Onc Dr Donneta Romberg who was seeing her for her hx of extensive LLE DVT and bilateral PE (resolved 01/06/2018) she was having same symptoms of brief chest pain lasting about 1 minute, which pt's father related to anxiety, intermittent dizziness and left sided tingling. She does require assistance with bathing and dressing.  History obtained from review of EMR, interview with family, facility staff and/or Nicole Wilson.   11/10/22 [facility]: CBC unremarkable CMP remarkable for elevated anion gap 16.0 and low osmolality 283.3, normal Cr 0.83 and eGFR 81. TSH elevated at 6.28 B12 low normal 278 Lipid panel unremarkable Vitamin D level sufficient at 35.0 I reviewed EMR for  available labs, medications, imaging, studies and related documents.  Records reviewed as per above.  ROS General: NAD ENMT: endorses intermittent dysphagia Cardiovascular: endorses intermittent chest pain of longstanding, denies DOE Pulmonary: denies cough, endorses intermittent difficulty taking a deep breath Abdomen: endorses good appetite,  denies constipation, endorses continence of bowel GU: denies dysuria, endorses continence of urine MSK:  has had left sided weakness since stroke, no falls reported Skin: denies rashes or wounds Neurological: endorses intermittent pain, denies insomnia Psych: mother endorses pt with anxiety Heme/lymph/immuno: denies bruises, abnormal bleeding  Physical Exam: Current and past weights: 144 lbs as of 10/06/22 at [facility] Constitutional: NAD General: WD/overweight ENMT: intact hearing with hearing aid, oral mucous membranes moist, dentition intact CV: S1S2, RRR with LUSB murmur, no LE edema, wearing stockings Pulmonary: CTAB, no increased work of breathing, no cough, room air Abdomen: normo-active BS + 4 quadrants, soft and non tender, no ascites GU: deferred MSK: no sarcopenia, moves all extremities, ambulatory with rollator and SBA Skin: warm and dry, no rashes or wounds on visible skin Neuro:  left side weaker than right, noted cognitive impairment Psych: mod anxious affect, A and O x 2 Hem/lymph/immuno: no widespread bruising  CURRENT PROBLEM LIST:  Patient Active Problem List   Diagnosis Date Noted  . Uterine mass 07/18/2021  . Chest pain 08/29/2018  . Left leg DVT ([place]) 10/20/2017  . Pulmonary embolism with acute cor pulmonale ([place]) 10/20/2017  . 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 Pulmonary emboli ([place]) 10/16/2017  . Obesity (BMI 30-39.9) 06/01/2017  . HLD (hyperlipidemia) 11/25/2015  . Spondylolisthesis of lumbosacral region 11/25/2015  . Cervical stenosis of spinal canal 03/16/2014  . Chronic chest wall pain 10/06/2013  . Bradycardia   . Carotid artery occlusion   . MVP (mitral valve prolapse)   . Hypothyroidism   . Orthostatic hypotension   . Down syndrome   . Hidradenitis   . Dyslipidemia   . Allergic rhinitis   . Moyamoya disease 03/03/2012   PAST MEDICAL HISTORY:  Active Ambulatory Problems    Diagnosis Date Noted  .  Hypothyroidism   . Orthostatic hypotension   . Down syndrome   . Hidradenitis   . Dyslipidemia   . Allergic rhinitis   . Bradycardia   . Carotid artery occlusion   . MVP (mitral valve prolapse)   . Chronic chest wall pain 10/06/2013  . Cervical stenosis of spinal canal 03/16/2014  . HLD (hyperlipidemia) 11/25/2015  . Moyamoya disease 03/03/2012  . Spondylolisthesis of lumbosacral region 11/25/2015  . Obesity (BMI 30-39.9) 06/01/2017  . Left leg DVT ([place]) 10/20/2017  . Pulmonary embolism with acute cor pulmonale ([place]) 10/20/2017  . 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 Pulmonary emboli ([place]) 10/16/2017  . Chest pain 08/29/2018  . Uterine mass 07/18/2021   Resolved Ambulatory Problems    Diagnosis Date Noted  . CAD (coronary artery disease)    Past Medical History:  Diagnosis Date  . Cervical spondylosis   . Down's syndrome   . Hyperlipidemia    SOCIAL HX:  Social History   Tobacco Use  . Smoking status: Never  . Smokeless tobacco: Never  Substance Use Topics  . Alcohol use: No   FAMILY HX:  Family History  Problem Relation Age of Onset  . Multiple myeloma Father   . Prostate cancer Father   . Heart attack Neg Hx   . Stroke Neg Hx  Preferred Pharmacy: [facility]:  Allergies  Allergen Reactions  . Cephalexin Rash  . Phenytoin Other (See Comments) and Rash    Pt doesnt remember  Pt doesnt remember  Pt doesnt remember     PERTINENT MEDICATIONS:  Outpatient Encounter Medications as of 11/30/2022  Medication Sig  . acetaminophen (TYLENOL) 650 MG CR tablet Take 1 tablet by mouth 2 (two) times daily as needed.  (Patient not taking: Reported on 10/06/2022)  . aspirin EC 81 MG tablet Take 81 mg by mouth daily.  Marland Kitchen buPROPion (WELLBUTRIN XL) 150 MG 24 hr tablet Take 150 mg by mouth daily.  Marland Kitchen donepezil (ARICEPT) 5 MG tablet Take 5 mg by mouth daily.  Marland Kitchen ELIQUIS 5 MG TABS tablet TAKE 1 TABLET BY MOUTH TWICE DAILY  . gabapentin  (NEURONTIN) 600 MG tablet Take 1 tablet (600 mg total) by mouth 2 (two) times daily.  . hydrOXYzine (ATARAX) 25 MG tablet Take 25 mg by mouth 2 (two) times daily as needed. (Patient not taking: Reported on 10/06/2022)  . levothyroxine (SYNTHROID, LEVOTHROID) 100 MCG tablet Take 50 mcg by mouth daily.   . metoprolol tartrate (LOPRESSOR) 100 MG tablet Take 1, 100 mg tablet, 2 hours before Cardiac CT. (Patient not taking: Reported on 10/06/2022)  . midodrine (PROAMATINE) 2.5 MG tablet Take 2.5 mg by mouth 3 (three) times daily with meals.  . Multiple Vitamin (MULTIVITAMIN WITH MINERALS) TABS tablet Take 1 tablet by mouth daily.  . sertraline (ZOLOFT) 50 MG tablet Take 50 mg by mouth at bedtime.   No facility-administered encounter medications on file as of 11/30/2022.     -------------------------------------------------------------------------------------------------------------------------------------------------------------------------------------------------------------------------------------------- Advance Care Planning/Goals of Care: Goals include to maximize quality of life and symptom management. Patient/health care surrogate gave his/her permission to discuss.Our advance care planning conversation included a discussion about:    The value and importance of advance care planning  Experiences with loved ones who have been seriously ill or have died  Exploration of personal, cultural or spiritual beliefs that might influence medical decisions  Exploration of goals of care in the event of a sudden injury or illness  Identification  of a healthcare agent and guardian is Nicole Wilson (father) [phone] Review and updating or creation of an  advance directive document . Decision not to resuscitate or to de-escalate disease focused treatments due to poor prognosis. CODE STATUS:     Thank you for the opportunity to participate in the care of Nicole Wilson.  The palliative care team will  continue to follow. Please call our office at [phone] if we can be of additional assistance.   Marijo Conception, FNP-C  COVID-19 PATIENT SCREENING TOOL Asked and negative response unless otherwise noted:  Have you had symptoms of covid, tested positive or been in contact with someone with symptoms/positive test in the past 5-10 days? unknown

## 2023-01-12 ENCOUNTER — Non-Acute Institutional Stay: Payer: 59 | Admitting: Family Medicine

## 2023-01-12 ENCOUNTER — Encounter: Payer: Self-pay | Admitting: Family Medicine

## 2023-01-12 VITALS — HR 59 | Temp 97.7°F | Resp 16

## 2023-01-12 DIAGNOSIS — F03B3 Unspecified dementia, moderate, with mood disturbance: Secondary | ICD-10-CM

## 2023-01-12 DIAGNOSIS — I69398 Other sequelae of cerebral infarction: Secondary | ICD-10-CM

## 2023-01-12 DIAGNOSIS — Q909 Down syndrome, unspecified: Secondary | ICD-10-CM

## 2023-01-12 DIAGNOSIS — Z515 Encounter for palliative care: Secondary | ICD-10-CM

## 2023-01-12 DIAGNOSIS — I675 Moyamoya disease: Secondary | ICD-10-CM

## 2023-01-12 NOTE — Progress Notes (Signed)
[place] Consult Note Telephone: [phone]  Fax: [phone]   Date of encounter: 01/12/23 8:07 PM PATIENT NAME: Nicole Wilson [place] [address]   [phone] (home) [phone] (work) DOB: 26-Jan-1971 MRN: [id number] PRIMARY CARE PROVIDER:    Aretta Nip, MD,  [place] [address] [phone]  REFERRING PROVIDER:   Aretta Nip, MD [place],  [place] [address] [phone]    Contact Information     Name Relation Home Work [place] Father   [phone]   [place] Mother [phone]  [phone]        I met face to face with patient and mother in their shared memory care ALF apartment at  facility to address advance care planning and complex medical decision making. This is a follow up visit.  ADVANCE CARE PLANNING/GOALS OF [place] AND PHONE NUMBER: Sisters Nicole Wilson and Nicole Wilson.  Nicole Wilson's contact number is [phone].  Father was Nicole Wilson POA and although mother is still living she has dementia herself and has to have assistance for her own IADLs.  CODE STATUS: Full Code at present  ASSESSMENT AND / RECOMMENDATIONS:  PPS: 60% Moderate dementia with mood disturbance/Down syndrome/Moyamoya Disease Likely vascular and ischemic components to dementia given hx of stroke with residual left sided hemiparesis. Also has hx of PE and on anticoagulation. Possible current acting out behaviors with reluctance to do what is asked. Continues on Donepezil and Zoloft. Continue to offer opportunities for socialization to allow pt to remain awake during the day and sleep nights. May benefit from psychiatric evaluation Need to discuss risks of cerebral bleeding with Moyamoya disease and fall risk with pt's sisters.  2.  Abnormality of gait as late effect of CVA Possible Wilson contribution to  recent falls Possible depression affecting pt's willingness to be mobile and work with therapy. Given fall risk and bleeding risk, need to discuss risk versus benefit of full anticoagulation with sisters.  3.   Palliative Care Encounter Spoke with facility PCP who recently addressed issues of capacity of pt's mother to help with obtaining legal rights to help manage mother/sister finances and health care.     Follow up Palliative Care Visit:  Palliative Care continuing to follow up by monitoring for changes in appetite, weight, functional and cognitive status for chronic disease progression and management in agreement with patient's stated goals of care. Next visit in 4 weeks or prn.  This visit was coded based on medical decision making (MDM).  Chief Complaint  Palliative Care is following pt with Down's Syndrome, Dementia and Moyamoya disease.  Palliative Care is assisting pt's sisters who have placed her in a memory care apartment with their mother.  HISTORY OF PRESENT ILLNESS: Nicole Wilson is a 52 y.o. year old female who lives with her mother in an ALF memory care apartment at [facility]. Both have dementia.  Nicole Wilson also has Moyamoya Disease.  Her father recently died unexpectedly and pt has not been told due to concern for deterioration in functional and emotional status.  Facility staff indicates that has been observed letting herself slide out of her chair onto the floor when "there is something she doesn't want to do".  She has ben working with PT and ambulating with her rollator until recently and now declines to work with therapy, wanting to roll around and sleep most of the day.  Today she  is noted to be in her ALF apartment with her mom after lunch dozing in a recliner chair.  Her personal care attendant has been with her.  When awakened she is not supportive of answering questions or interested in interacting with this provider. She immediately tries to go back to sleep. Mother  and care giver have no large concerns except as mentioned above with not wanting to cooperate with therapy.   ACTIVITIES OF DAILY LIVING: CONTINENT OF BLADDER? No CONTINENT OF BOWEL? Yes  MOBILITY:   INDEPENDENTLY AMBULATORY?  No   AMBULATORY WITH ASSISTIVE DEVICE:  WHEELCHAIR/rollator  APPETITE? 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 ([place]) 10/20/2017   Pulmonary emboli ([place]) 10/16/2017   Obesity (BMI 30-39.9) 06/01/2017   HLD (hyperlipidemia) 11/25/2015   Spondylolisthesis of lumbosacral region 11/25/2015   Cervical stenosis of spinal canal 03/16/2014   Chronic chest wall pain 10/06/2013   Bradycardia    Carotid artery occlusion    MVP (mitral valve prolapse)    Hypothyroidism    Orthostatic hypotension    Down syndrome    Hidradenitis    Dyslipidemia    Allergic rhinitis    Moyamoya disease 03/03/2012   PAST MEDICAL HISTORY:  Active Ambulatory Problems    Diagnosis Date Noted   Hypothyroidism    Orthostatic hypotension    Down syndrome    Hidradenitis    Dyslipidemia    Allergic rhinitis    Bradycardia    Carotid artery occlusion    MVP (mitral valve prolapse)    Chronic chest wall pain 10/06/2013   Cervical stenosis of spinal canal 03/16/2014   HLD (hyperlipidemia) 11/25/2015   Moyamoya disease 03/03/2012   Spondylolisthesis of lumbosacral region 11/25/2015   Obesity (BMI 30-39.9) 06/01/2017   Left leg DVT ([place]) 10/20/2017   Pulmonary embolism with acute cor pulmonale (HCC) 10/20/2017   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 ([place]) 10/20/2017   Pulmonary emboli ([place]) 10/16/2017   Chest pain 08/29/2018   Uterine mass 07/18/2021   Palliative care encounter 12/11/2022    Resolved Ambulatory Problems    Diagnosis Date Noted   CAD (coronary artery disease)    Past Medical History:  Diagnosis Date   Cervical spondylosis    Down's syndrome    Hyperlipidemia    SOCIAL HX:  Social History   Tobacco Use   Smoking status: Never   Smokeless tobacco: Never  Substance Use Topics   Alcohol use: No   FAMILY HX:  Family History  Problem Relation Age of Onset   Multiple myeloma Father    Prostate cancer Father    Heart attack Neg Hx    Stroke Neg Hx       Preferred Pharmacy: [facility]:  Allergies  Allergen Reactions   Cephalexin Rash   Phenytoin Other (See Comments) and Rash    Pt doesnt remember  Pt doesnt remember  Pt doesnt remember     PERTINENT MEDICATIONS:  Outpatient Encounter Medications as of 01/12/2023  Medication Sig   acetaminophen (TYLENOL) 650 MG CR tablet Take 1 tablet by mouth 2 (two) times daily as needed.   aspirin EC 81 MG tablet Take 81 mg by mouth daily.   donepezil (ARICEPT) 5 MG  tablet Take 5 mg by mouth daily.   ELIQUIS 5 MG TABS tablet TAKE 1 TABLET BY MOUTH TWICE DAILY   gabapentin (NEURONTIN) 600 MG tablet Take 1 tablet (600 mg total) by mouth 2 (two) times daily. (Patient taking differently: Take 100-600 mg by mouth as directed. Take Gabapentin 100 mg po Q am, 600 mg by mouth QHS)   hydrOXYzine (ATARAX) 25 MG tablet Take 25 mg by mouth 2 (two) times daily as needed.   levothyroxine (SYNTHROID) 50 MCG tablet Take 50 mcg by mouth daily.   midodrine (PROAMATINE) 2.5 MG tablet Take 2.5 mg by mouth 3 (three) times daily with meals.   Multiple Vitamin (MULTIVITAMIN WITH MINERALS) TABS tablet Take 1 tablet by mouth daily.   sertraline (ZOLOFT) 100 MG tablet Take 200 mg by mouth at bedtime.   No facility-administered encounter medications on file as of 01/12/2023.    History obtained from review of EMR, discussion with primary team, and interview with family, facility staff, paid caregiver and/or patient.    I  reviewed available labs, medications, imaging, studies and related documents from the EMR.  There are no new records since last visit.   Physical Exam: GENERAL: NAD LUNGS: CTAB, no increased work of breathing, room air CARDIAC:  S1S2, RRR with left sternal border, trace non pitting BLE edema, No cyanosis ABD:  Normo-active BS x 4 quads, soft, non-tender EXTREMITIES: Normal ROM, no deformity, strength equal in BUE, No muscle atrophy/subcutaneous fat loss, trace non-pitting edema wearing compression stockings BLE NEURO:  Noted BLE weakness and noted cognitive impairment with developmental delay PSYCH:  non-anxious affect, mildly irritable, Sleeping, arousable, oriented x 2  Thank you for the opportunity to participate in the care of Tito Dine. Please call our main office at [phone] if we can be of additional assistance.    Damaris Hippo FNP-C  [email or web address] .[facility]  Phone:  [phone]

## 2023-02-05 ENCOUNTER — Non-Acute Institutional Stay: Payer: 59 | Admitting: Family Medicine

## 2023-02-05 ENCOUNTER — Encounter: Payer: Self-pay | Admitting: Family Medicine

## 2023-02-05 VITALS — BP 98/70 | HR 49 | Temp 97.8°F | Resp 16

## 2023-02-05 DIAGNOSIS — Q909 Down syndrome, unspecified: Secondary | ICD-10-CM

## 2023-02-05 DIAGNOSIS — F03B3 Unspecified dementia, moderate, with mood disturbance: Secondary | ICD-10-CM

## 2023-02-05 DIAGNOSIS — I69398 Other sequelae of cerebral infarction: Secondary | ICD-10-CM

## 2023-02-05 NOTE — Progress Notes (Signed)
[place] Consult Note Telephone: [phone]  Fax: [phone]   Date of encounter: 01/12/23 8:07 PM PATIENT NAME: Nicole Wilson [place] [address]   [phone] (home) [phone] (work) DOB: Oct 18, 1971 MRN: [id number] PRIMARY CARE PROVIDER:    Aretta Nip, MD,  [place] [address] [phone]  REFERRING PROVIDER:   Aretta Nip, MD [place],  [place] [address] [phone]    Contact Information     Name Relation Home Work [place] Father   [phone]   [place] Mother [phone]  [phone]        I met face to face with patient and mother in their shared memory care ALF apartment at  facility to address advance care planning and complex medical decision making. This is a follow up visit.  ADVANCE CARE PLANNING/GOALS OF [place] AND PHONE NUMBER: Sisters Holli Humbles and Mandi Clift.  Ashley's contact number is [phone].  Father was [facility] POA and although mother is still living she has dementia herself and has to have assistance for her own IADLs.  CODE STATUS: Full Code at present  ASSESSMENT AND / RECOMMENDATIONS:  PPS: 60% Moderate dementia with mood disturbance/Down syndrome/Moyamoya Disease .FAST 7 score 6c Continues on Donepezil and Zoloft.  2.  Abnormality of gait as late effect of CVA Left sided hemiparesis with no recent falls. Remains on anticoagulation.      Follow up Palliative Care Visit:  Palliative Care continuing to follow up by monitoring for changes in appetite, weight, functional and cognitive status for chronic disease progression and management in agreement with patient's stated goals of care. Next visit in 4 weeks or prn.  This visit was coded based on medical decision making (MDM).  Chief Complaint  Palliative Care is following pt with Down's  Syndrome, Dementia and Moyamoya disease.  Palliative Care is assisting pt's sisters who have placed her in a memory care apartment with their mother.  HISTORY OF PRESENT ILLNESS: Nicole Wilson is a 52 y.o. year old female who lives with her mother in an ALF memory care apartment at [facility]. Both have dementia.  Coletha also has Moyamoya Disease.  Her father is deceased  which pt has not been told due to concern for possible decompensation.  Sisters are caring for her and her mother who also has dementia and lives in the memory care apartment with her. Pt repeated several times that she has had a prior stroke and has residual left sided weakness. She denies pain, SOB, nausea, vomiting.  She is seen sitting at the table with her mother getting ready to have lunch.  She visibly struggles intermittently with word finding and completion of a thought.   ACTIVITIES OF DAILY LIVING: CONTINENT OF BLADDER? No CONTINENT OF BOWEL? Yes  MOBILITY:   AMBULATORY WITH ASSISTIVE DEVICE:  WHEELCHAIR/rollator  APPETITE? 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 ([place]) 10/20/2017   Pulmonary emboli ([place]) 10/16/2017   Obesity (BMI 30-39.9) 06/01/2017   HLD (hyperlipidemia) 11/25/2015   Spondylolisthesis of lumbosacral region 11/25/2015   Cervical stenosis of spinal canal 03/16/2014   Chronic  chest wall pain 10/06/2013   Bradycardia    Carotid artery occlusion    MVP (mitral valve prolapse)    Hypothyroidism    Orthostatic hypotension    Down syndrome    Hidradenitis    Dyslipidemia    Allergic rhinitis    Moyamoya disease 03/03/2012   PAST MEDICAL HISTORY:  Active Ambulatory Problems    Diagnosis Date Noted    Hypothyroidism    Orthostatic hypotension    Down syndrome    Hidradenitis    Dyslipidemia    Allergic rhinitis    Bradycardia    Carotid artery occlusion    MVP (mitral valve prolapse)    Chronic chest wall pain 10/06/2013   Cervical stenosis of spinal canal 03/16/2014   HLD (hyperlipidemia) 11/25/2015   Moyamoya disease 03/03/2012   Spondylolisthesis of lumbosacral region 11/25/2015   Obesity (BMI 30-39.9) 06/01/2017   Left leg DVT ([place]) 10/20/2017   Pulmonary embolism with acute cor pulmonale (HCC) 10/20/2017   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 ([place]) 10/20/2017   Pulmonary emboli ([place]) 10/16/2017   Chest pain 08/29/2018   Uterine mass 07/18/2021   Palliative care encounter 12/11/2022   Resolved Ambulatory Problems    Diagnosis Date Noted   CAD (coronary artery disease)    Past Medical History:  Diagnosis Date   Cervical spondylosis    Down's syndrome    Hyperlipidemia    Preferred Pharmacy: [facility]:       Allergies  Allergen Reactions   Cephalexin Rash   Phenytoin Other (See Comments) and Rash      Pt doesnt remember   Pt doesnt remember  Pt doesnt remember     PERTINENT MEDICATIONS:      Outpatient Encounter Medications as of 01/12/2023  Medication Sig   acetaminophen (TYLENOL) 650 MG CR tablet Take 1 tablet by mouth 2 (two) times daily as needed.   aspirin EC 81 MG tablet Take 81 mg by mouth daily.   donepezil (ARICEPT) 5 MG tablet Take 5 mg by mouth daily.   ELIQUIS 5 MG TABS tablet TAKE 1 TABLET BY MOUTH TWICE DAILY   gabapentin (NEURONTIN) 600 MG tablet Take 1 tablet (600 mg total) by mouth 2 (two) times daily. (Patient taking differently: Take 100-600 mg by mouth as directed. Take Gabapentin 100 mg po Q am, 600 mg by mouth QHS)   hydrOXYzine (ATARAX) 25 MG tablet Take 25 mg by mouth 2 (two) times daily as needed.   levothyroxine (SYNTHROID) 50 MCG tablet Take 50 mcg by mouth daily.   midodrine (PROAMATINE) 2.5 MG tablet Take 2.5 mg by  mouth 3 (three) times daily with meals.   Multiple Vitamin (MULTIVITAMIN WITH MINERALS) TABS tablet Take 1 tablet by mouth daily.   sertraline (ZOLOFT) 100 MG tablet Take 200 mg by mouth at bedtime.    No facility-administered encounter medications on file as of 01/12/2023.      History obtained from review of EMR, discussion with family and/or patient.      I reviewed available labs, medications, imaging, studies and related documents from the EMR.  There are no new records since last visit.    Physical Exam: GENERAL: NAD LUNGS: CTAB, no increased work of breathing, room air CARDIAC:  S1S2, slow/regular bradycardic rate in upper 40s, No edema/ cyanosis ABD:  Normo-active BS x 4 quads, soft, non-tender EXTREMITIES: Normal ROM, no deformity, No muscle atrophy/subcutaneous fat loss NEURO:  Noted left sided weakness and cognitive impairment with psychomotor delay vs aphasia  PSYCH:  non-anxious affect, awake, alert, oriented x 2   Thank you for the opportunity to participate in the care of Tito Dine. Please call our main office at [phone] if we can be of additional assistance.    Damaris Hippo FNP-C  [email or web address] .[facility]  Phone:  [phone]

## 2023-03-17 ENCOUNTER — Encounter: Payer: Self-pay | Admitting: Family Medicine

## 2023-03-17 ENCOUNTER — Non-Acute Institutional Stay: Payer: 59 | Admitting: Family Medicine

## 2023-03-17 VITALS — BP 102/56 | HR 79 | Temp 98.0°F | Resp 18

## 2023-03-17 DIAGNOSIS — F03B3 Unspecified dementia, moderate, with mood disturbance: Secondary | ICD-10-CM

## 2023-03-17 DIAGNOSIS — I69398 Other sequelae of cerebral infarction: Secondary | ICD-10-CM
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
## 2023-10-07 ENCOUNTER — Inpatient Hospital Stay: Payer: 59 | Attending: Oncology | Admitting: Oncology

## 2023-10-07 ENCOUNTER — Inpatient Hospital Stay: Payer: 59

## 2023-10-07 VITALS — BP 95/58 | HR 58 | Temp 97.9°F | Resp 18 | Ht 59.0 in | Wt 162.0 lb

## 2023-10-07 DIAGNOSIS — Z7901 Long term (current) use of anticoagulants: Secondary | ICD-10-CM | POA: Insufficient documentation

## 2023-10-07 DIAGNOSIS — Z86718 Personal history of other venous thrombosis and embolism: Secondary | ICD-10-CM | POA: Insufficient documentation

## 2023-10-07 DIAGNOSIS — Q909 Down syndrome, unspecified: Secondary | ICD-10-CM | POA: Insufficient documentation

## 2023-10-07 DIAGNOSIS — Z7982 Long term (current) use of aspirin: Secondary | ICD-10-CM | POA: Diagnosis not present

## 2023-10-07 DIAGNOSIS — I2609 Other pulmonary embolism with acute cor pulmonale: Secondary | ICD-10-CM | POA: Diagnosis not present

## 2023-10-07 DIAGNOSIS — I82402 Acute embolism and thrombosis of unspecified deep veins of left lower extremity: Secondary | ICD-10-CM | POA: Diagnosis not present

## 2023-10-07 DIAGNOSIS — Z23 Encounter for immunization: Secondary | ICD-10-CM

## 2023-10-07 MED ORDER — APIXABAN 2.5 MG PO TABS: 2.5000 mg | ORAL_TABLET | Freq: Two times a day (BID) | ORAL | 2 refills | Status: AC

## 2023-10-07 MED ORDER — INFLUENZA VIRUS VACC SPLIT PF (FLUZONE) 0.5 ML IM SUSY
0.5000 mL | PREFILLED_SYRINGE | Freq: Once | INTRAMUSCULAR | Status: AC
  Administered 2023-10-07: 0.5 mL via INTRAMUSCULAR
  Filled 2023-10-07: qty 0.5

## 2023-10-07 NOTE — Progress Notes (Signed)
[facility] OFFICE PROGRESS NOTE   Diagnosis: Chronic anticoagulation  INTERVAL HISTORY:   Ms. Nicole Wilson returns for a scheduled visit.  She is here with her sister.  She and her mother live in an assisted care facility.  She is being followed by cardiology for chest discomfort and presyncope events.  Her sister reports Nicole Wilson had 1 fall approximately 4 months ago.  No bleeding.  No symptom of recurrent thrombosis.  She is no longer taking aspirin.  She continues apixaban.  Objective:  Vital signs in last 24 hours:  Blood pressure (!) 95/58, pulse (!) 58, temperature 97.9 F (36.6 C), temperature source Temporal, resp. rate 18, height 4\' 11"  (1.499 m), weight 162 lb (73.5 kg), SpO2 100%.    Lymphatics: Cervical, supraclavicular, axillary, or inguinal nodes Resp: Lungs clear bilaterally Cardio: Regular rate and rhythm GI: No hepatosplenomegaly, nontender Vascular: No leg edema, the left lower leg is slightly larger than right side, discoloration at the left lower leg    Lab Results:  Lab Results  Component Value Date   WBC 5.9 08/29/2018   HGB 13.0 08/29/2018   HCT 41.0 08/29/2018   MCV 98.3 08/29/2018   PLT 295 08/29/2018   NEUTROABS 4.3 08/29/2018    CMP  Lab Results  Component Value Date   NA 139 08/29/2018   K 3.7 08/29/2018   CL 108 08/29/2018   CO2 24 08/29/2018   GLUCOSE 90 08/29/2018   BUN 11 08/29/2018   CREATININE 0.87 08/29/2018   CALCIUM 8.7 (L) 08/29/2018   PROT 6.9 08/29/2018   ALBUMIN 3.2 (L) 08/29/2018   AST 24 08/29/2018   ALT 15 08/29/2018   ALKPHOS 61 08/29/2018   BILITOT 0.2 (L) 08/29/2018   GFRNONAA >60 08/29/2018   GFRAA >60 08/29/2018    No results found for: "CEA1", "CEA", "BMW413", "CA125"  Lab Results  Component Value Date   INR 1.22 08/29/2018   LABPROT 15.3 (H) 08/29/2018    Imaging:  No results found.  Medications: I have reviewed the patient's current medications.   Assessment/Plan: Extensive left  lower extremity DVT and bilateral pulmonary embolism 10/19/2017 Presentation with a syncope event 10/18/2017 Left lower extremity Doppler 10/19/2017 confirmed extensive left lower deep and superficial venous thrombosis CT chest 10/20/2017 with bilateral central pulmonary emboli with right heart strain Left lower extremity thrombectomy procedure 10/20/2017 Heparin anticoagulation followed by apixaban CT 01/06/2018-resolution of pulmonary emboli CT 09/05/2018-negative for acute pulmonary embolism.  Linear density in the right lower lobe pulmonary artery segmental branches appears to be chronic and represent sequela of prior pulmonary embolism.   2.   Down syndrome   3.   History of bilateral carotid artery surgery for treatment of moyamoya disease   4.   Recurrent ear infections   5.   Low back pain, status post lumbar fusion January 2017 6.   Left C3-4, C4-5 laminectomy and foraminotomy 07/13/2018     Disposition: NicoleWilson has a remote history of a DVT and pulmonary emboli.  She is maintained on chronic anticoagulation therapy.  She is at risk of falling.  I discussed the indication for continue anticoagulation with her sister.  We decided to continue apixaban with reduced dose intensity.  If she has increased fall risk the plan is to discontinue anticoagulation therapy.  She will return for an office visit in 1 year.  She will continue follow-up with cardiology for evaluation of presyncope and chest discomfort.  She has been diagnosed with sleep apnea.  Nicole Wilson  Truett Perna, MD  10/07/2023  10:43 AM

## 2023-10-07 NOTE — Addendum Note (Signed)
Addended by: Wandalee Ferdinand on: 10/07/2023 11:15 AM   Modules accepted: Orders

## 2024-07-21 ENCOUNTER — Telehealth: Payer: Self-pay

## 2024-07-21 NOTE — Telephone Encounter (Signed)
 The patient appointment originally scheduled for 10/06/24 has been rescheduled to 10/19/24 due to the provider's updated office hours. A reminder letter has been sent to the patient.

## 2024-10-06 ENCOUNTER — Ambulatory Visit: Payer: 59 | Admitting: Oncology

## 2024-10-19 ENCOUNTER — Ambulatory Visit: Admitting: Oncology

## 2024-12-17 DEATH — deceased
